# Patient Record
Sex: Male | Born: 1990 | Race: White | Hispanic: No | Marital: Single | Smoking: Former smoker
Health system: Southern US, Community
[De-identification: ages and names within clinical notes are randomized; demographics above are authoritative.]

## PROBLEM LIST (undated history)

## (undated) DIAGNOSIS — J189 Pneumonia, unspecified organism: Secondary | ICD-10-CM

## (undated) DIAGNOSIS — K219 Gastro-esophageal reflux disease without esophagitis: Secondary | ICD-10-CM

## (undated) HISTORY — PX: HERNIA REPAIR: SHX51

## (undated) HISTORY — PX: WISDOM TOOTH EXTRACTION: SHX21

---
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
## 2010-07-05 ENCOUNTER — Emergency Department (HOSPITAL_COMMUNITY)
Admission: EM | Admit: 2010-07-05 | Discharge: 2010-07-06 | Disposition: A | Discharge status: Home / Self Care | Payer: No Typology Code available for payment source | Attending: Emergency Medicine | Admitting: Emergency Medicine

## 2010-07-05 DIAGNOSIS — M79609 Pain in unspecified limb: Secondary | ICD-10-CM | POA: Insufficient documentation

## 2010-07-05 DIAGNOSIS — T148XXA Other injury of unspecified body region, initial encounter: Secondary | ICD-10-CM | POA: Insufficient documentation

## 2010-07-05 DIAGNOSIS — M542 Cervicalgia: Secondary | ICD-10-CM | POA: Insufficient documentation

## 2010-07-06 ENCOUNTER — Emergency Department (HOSPITAL_COMMUNITY): Payer: No Typology Code available for payment source

## 2010-07-07 LAB — CBC
MCHC: 34 g/dL (ref 30.0–36.0)
Platelets: 122 10*3/uL — ABNORMAL LOW (ref 150–400)
RDW: 12 % (ref 11.5–15.5)

## 2010-07-07 LAB — COMPREHENSIVE METABOLIC PANEL
ALT: 16 U/L (ref 0–53)
AST: 23 U/L (ref 0–37)
Calcium: 9.1 mg/dL (ref 8.4–10.5)
Creatinine, Ser: 1.1 mg/dL (ref 0.4–1.5)
GFR calc Af Amer: >60 mL/min (ref >60)
Sodium: 141 mEq/L (ref 135–145)
Total Protein: 6.7 g/dL (ref 6.0–8.3)

## 2010-07-07 LAB — DIFFERENTIAL
Eosinophils Absolute: 0.1 10*3/uL (ref 0.0–0.7)
Eosinophils Relative: 1 % (ref 0–5)
Lymphocytes Relative: 22 % (ref 12–46)
Lymphs Abs: 2 10*3/uL (ref 0.7–4.0)
Monocytes Relative: 9 % (ref 3–12)
Neutrophils Relative %: 68 % (ref 43–77)

## 2010-07-07 LAB — RAPID URINE DRUG SCREEN, HOSP PERFORMED
Amphetamines: NOT DETECTED
Barbiturates: NOT DETECTED
Benzodiazepines: POSITIVE — AB
Cocaine: POSITIVE — AB
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
## 2019-08-15 ENCOUNTER — Other Ambulatory Visit: Payer: Self-pay

## 2021-10-07 ENCOUNTER — Other Ambulatory Visit: Payer: Self-pay

## 2021-10-07 ENCOUNTER — Emergency Department (HOSPITAL_COMMUNITY)
Admission: EM | Admit: 2021-10-07 | Discharge: 2021-10-08 | Discharge status: Against Medical Advice | Payer: Self-pay | Attending: Emergency Medicine | Admitting: Emergency Medicine

## 2021-10-07 ENCOUNTER — Encounter (HOSPITAL_COMMUNITY): Payer: Self-pay

## 2021-10-07 DIAGNOSIS — F17210 Nicotine dependence, cigarettes, uncomplicated: Secondary | ICD-10-CM | POA: Insufficient documentation

## 2021-10-07 DIAGNOSIS — M25551 Pain in right hip: Secondary | ICD-10-CM

## 2021-10-07 DIAGNOSIS — M5441 Lumbago with sciatica, right side: Secondary | ICD-10-CM | POA: Insufficient documentation

## 2021-10-07 DIAGNOSIS — R339 Retention of urine, unspecified: Secondary | ICD-10-CM | POA: Insufficient documentation

## 2021-10-07 DIAGNOSIS — Z5321 Procedure and treatment not carried out due to patient leaving prior to being seen by health care provider: Secondary | ICD-10-CM | POA: Insufficient documentation

## 2021-10-07 MED ORDER — IBUPROFEN 400 MG PO TABS
400.0000 mg | ORAL_TABLET | Freq: Once | ORAL | Status: AC | PRN
  Administered 2021-10-07: 400 mg via ORAL
  Filled 2021-10-07: qty 1

## 2021-10-07 NOTE — ED Triage Notes (Addendum)
Pt c/o R hip pain. Pt states he has been having intermittent flare ups for the last several months and tonight is the worst. Pt states the pain radiates from his R hip down his R leg. Denies injury.

## 2021-10-07 NOTE — ED Notes (Signed)
Pt able to stand and pivot. Barely able to place weight on leg.

## 2021-10-08 ENCOUNTER — Encounter (HOSPITAL_COMMUNITY): Payer: Self-pay

## 2021-10-08 LAB — URINALYSIS, ROUTINE W REFLEX MICROSCOPIC
Bilirubin Urine: NEGATIVE
Glucose, UA: NEGATIVE mg/dL
Hgb urine dipstick: NEGATIVE
Ketones, ur: NEGATIVE mg/dL
Leukocytes,Ua: NEGATIVE
Nitrite: NEGATIVE
Protein, ur: NEGATIVE mg/dL
Specific Gravity, Urine: 1.01 (ref 1.005–1.030)
pH: 6 (ref 5.0–8.0)

## 2021-10-08 LAB — BASIC METABOLIC PANEL
Anion gap: 8 (ref 5–15)
BUN: 10 mg/dL (ref 6–20)
CO2: 26 mmol/L (ref 22–32)
Calcium: 8.8 mg/dL — ABNORMAL LOW (ref 8.9–10.3)
Chloride: 103 mmol/L (ref 98–111)
Creatinine, Ser: 0.81 mg/dL (ref 0.61–1.24)
GFR, Estimated: >60 mL/min (ref >60)
Glucose, Bld: 105 mg/dL — ABNORMAL HIGH (ref 70–99)
Potassium: 3.9 mmol/L (ref 3.5–5.1)
Sodium: 137 mmol/L (ref 135–145)

## 2021-10-08 LAB — CBC
HCT: 39.1 % (ref 39.0–52.0)
Hemoglobin: 13.2 g/dL (ref 13.0–17.0)
MCH: 31 pg (ref 26.0–34.0)
MCHC: 33.8 g/dL (ref 30.0–36.0)
MCV: 91.8 fL (ref 80.0–100.0)
Platelets: 228 10*3/uL (ref 150–400)
RBC: 4.26 MIL/uL (ref 4.22–5.81)
RDW: 11.9 % (ref 11.5–15.5)
WBC: 11.8 10*3/uL — ABNORMAL HIGH (ref 4.0–10.5)
nRBC: 0 % (ref 0.0–0.2)

## 2021-10-08 MED ORDER — KETOROLAC TROMETHAMINE 15 MG/ML IJ SOLN: 15.0000 mg | Freq: Once | INTRAMUSCULAR | Status: DC

## 2021-10-08 MED ORDER — KETOROLAC TROMETHAMINE 15 MG/ML IJ SOLN
15.0000 mg | Freq: Once | INTRAMUSCULAR | Status: DC
  Administered 2021-10-08: 15 mg via INTRAMUSCULAR
  Filled 2021-10-08: qty 1

## 2021-10-08 NOTE — ED Provider Notes (Signed)
[facility]-EMERGENCY DEPT [facility] Emergency Department Provider Note MRN:  [id number]  Arrival date & time: 10/08/21     Chief Complaint   Hip Pain (/)   History of Present Illness   Douglas Turner is a 31 y.o. year-old male with no pertinent past medical history presenting to the ED with chief complaint of hip pain.  Pain to the right lower back/buttocks with radiation down the right leg into the hip, back of the leg, lateral leg, and into the groin.  Trouble walking or moving because of the pain.  Also not urinating as frequently today.  No bowel dysfunction, no numbness or weakness to the arms or legs.  Review of Systems  A thorough review of systems was obtained and all systems are negative except as noted in the HPI and PMH.   Patient's Health History   History reviewed. No pertinent past medical history.  History reviewed. No pertinent surgical history.  No family history on file.  Social History   Socioeconomic History   Marital status: Single    Spouse name: Not on file   Number of children: Not on file   Years of education: Not on file   Highest education level: Not on file  Occupational History   Not on file  Tobacco Use   Smoking status: Every Day    Types: Cigarettes   Smokeless tobacco: Never  Vaping Use   Vaping Use: Never used  Substance and Sexual Activity   Alcohol use: Yes   Drug use: Yes    Types: Marijuana    Comment: past use of other substances   Sexual activity: Not on file  Other Topics Concern   Not on file  Social History Narrative   Not on file   Social Determinants of Health   Financial Resource Strain: Not on file  Food Insecurity: Not on file  Transportation Needs: Not on file  Physical Activity: Not on file  Stress: Not on file  Social Connections: Not on file  Intimate Partner Violence: Not on file     Physical Exam   Vitals:   10/07/21 2059  BP: (!) 133/96  Pulse: (!) 102  Resp: 20  Temp: 100.3 F (37.9 C)   SpO2: 97%    CONSTITUTIONAL: Well-appearing, NAD NEURO/PSYCH:  Alert and oriented x 3, no focal deficits EYES:  eyes equal and reactive ENT/NECK:  no LAD, no JVD CARDIO: Regular rate, well-perfused, normal S1 and S2 PULM:  CTAB no wheezing or rhonchi GI/GU:  non-distended, non-tender MSK/SPINE:  No gross deformities, no edema SKIN:  no rash, atraumatic   *Additional and/or pertinent findings included in MDM below  Diagnostic and Interventional Summary    EKG Interpretation  Date/Time:    Ventricular Rate:    PR Interval:    QRS Duration:   QT Interval:    QTC Calculation:   R Axis:     Text Interpretation:         Labs Reviewed  URINALYSIS, ROUTINE W REFLEX MICROSCOPIC  CBC  BASIC METABOLIC PANEL    MR Lumbar Spine W Wo Contrast    (Results Pending)    Medications  ketorolac (TORADOL) 15 MG/ML injection 15 mg (has no administration in time range)  ibuprofen (ADVIL) tablet 400 mg (400 mg Oral Given 10/07/21 2107)     Procedures  /  Critical Care Procedures  ED Course and Medical Decision Making  Initial Impression and Ddx Patient with mild tachycardia, oral temp 100.3, clinically with sciatica  with some red flags noted, namely the urinary retention.  Postvoid residual of 120 cc of urine.  Epidural abscess is considered, given the otherwise normal neurological exam, MRI with and without of the lumbar spine would be adequate to evaluate for this disease process.  To be transferred to [facility] by private vehicle, Dr. Madilyn Hook is the accepting EDP.  Past medical/surgical history that increases complexity of ED encounter: None  Interpretation of Diagnostics No significant blood count or electrolyte disturbance, MRI pending.  Patient Reassessment and Ultimate Disposition/Management     Awaiting transfer.  Patient management required discussion with the following services or consulting groups:  None  Complexity of Problems Addressed Acute illness or injury that poses  threat of life of bodily function  Additional Data Reviewed and Analyzed Further history obtained from: Further history from spouse/family member  Additional Factors Impacting ED Encounter Risk Consideration of hospitalization  Douglas Sow. Pilar Plate, MD [facility] Emergency Medicine [facility] [email or web address]  Final Clinical Impressions(s) / ED Diagnoses     ICD-10-CM   1. Right hip pain  M25.551     2. Acute right-sided low back pain with right-sided sciatica  M54.41     3. Urinary retention  R33.9       ED Discharge Orders     None        Discharge Instructions Discussed with and Provided to Patient:    Discharge Instructions      We are transferring you to the [facility] emergency department for MRI imaging.  You have elected to travel via private vehicle, please drive straight there.      Douglas Sous, MD 10/08/21 [phone]

## 2021-10-08 NOTE — ED Notes (Signed)
Pt arrives POV from [facility], was sent here to have MRI of lumbar spine. Pt reports pain to right groin/hip and lower back.

## 2021-10-08 NOTE — Discharge Instructions (Signed)
We are transferring you to the [facility] emergency department for MRI imaging.  You have elected to travel via private vehicle, please drive straight there.

## 2021-10-08 NOTE — ED Notes (Signed)
 left in bladder

## 2021-10-08 NOTE — ED Notes (Signed)
Pt to go pov to [facility]. Assisted into vehicle with spouse. Verbalized understanding

## 2023-08-27 NOTE — Progress Notes (Signed)
 Anesthesia Review:  PCP: Cardiologist :  PPM/ ICD: Device Orders: Rep Notified:  Chest x-ray : EKG : Echo : Stress test: Cardiac Cath :   Activity level:  Sleep Study/ CPAP : Fasting Blood Sugar :      / Checks Blood Sugar -- times a day:    Blood Thinner/ Instructions /Last Dose: ASA / Instructions/ Last Dose :

## 2023-08-27 NOTE — Progress Notes (Signed)
 Surgery orders requested via Epic inbox.

## 2023-08-28 ENCOUNTER — Ambulatory Visit: Payer: Self-pay | Admitting: Emergency Medicine

## 2023-08-28 DIAGNOSIS — G8929 Other chronic pain: Secondary | ICD-10-CM

## 2023-08-28 NOTE — H&P (Signed)
 TOTAL HIP ADMISSION H&P  Patient is admitted for left total hip arthroplasty.  Subjective:  Chief Complaint: left hip pain  HPI: Douglas Turner, 33 y.o. male, has a history of pain and functional disability in the left hip(s) due to avascular necrosis and patient has failed non-surgical conservative treatments for greater than 12 weeks to include NSAID's and/or analgesics, corticosteriod injections, supervised PT with diminished ADL's post treatment, and activity modification.  Onset of symptoms was gradual starting 1 years ago with gradually worsening course since that time.The patient noted no past surgery on the left hip(s).  Patient currently rates pain in the left hip at 10 out of 10 with activity. Patient has night pain, worsening of pain with activity and weight bearing, pain that interfers with activities of daily living, and pain with passive range of motion. Patient has evidence of avascular necrosis by imaging studies. This condition presents safety issues increasing the risk of falls.  There is no current active infection.  There are no active problems to display for this patient.  No past medical history on file.  No past surgical history on file.  Current Outpatient Medications  Medication Sig Dispense Refill Last Dose/Taking   Buprenorphine HCl-Naloxone HCl 8-2 MG FILM Place 0.25 Film under the tongue daily.      ibuprofen  (ADVIL ) 200 MG tablet Take 200 mg by mouth every 6 (six) hours as needed for moderate pain (pain score 4-6).      loratadine (CLARITIN) 10 MG tablet Take 10 mg by mouth daily.      meloxicam (MOBIC) 15 MG tablet Take 15 mg by mouth daily.      omeprazole (PRILOSEC) 40 MG capsule Take 40 mg by mouth daily.      No current facility-administered medications for this visit.   Allergies  Allergen Reactions   Iodine Shortness Of Breath, Swelling and Rash   Shellfish Allergy Anaphylaxis    Social History   Tobacco Use   Smoking status: Every Day     Types: Cigarettes   Smokeless tobacco: Never  Substance Use Topics   Alcohol use: Yes    No family history on file.   Review of Systems  Musculoskeletal:  Positive for arthralgias.  All other systems reviewed and are negative.   Objective:  Physical Exam Constitutional:      General: He is not in acute distress.    Appearance: Normal appearance. He is normal weight.  HENT:     Head: Normocephalic and atraumatic.  Eyes:     Extraocular Movements: Extraocular movements intact.     Conjunctiva/sclera: Conjunctivae normal.     Pupils: Pupils are equal, round, and reactive to light.  Cardiovascular:     Rate and Rhythm: Normal rate and regular rhythm.     Pulses: Normal pulses.     Heart sounds: Normal heart sounds.  Pulmonary:     Effort: Pulmonary effort is normal. No respiratory distress.     Breath sounds: Normal breath sounds.  Abdominal:     General: Bowel sounds are normal. There is no distension.     Palpations: Abdomen is soft.     Tenderness: There is no abdominal tenderness.  Musculoskeletal:        General: Tenderness present.     Cervical back: Normal range of motion and neck supple.     Comments: TTP over groin, lateral aspect, greater trochanter.  No IT band tenderness.  No significant swelling.  No overlying lesions of area of chief complaint.  Decreased strength and ROM due to elicited pain.  Dorsiflexion and plantarflexion intact.  BLE appear grossly neurovascularly intact.  Gait mildly antalgic.   Lymphadenopathy:     Cervical: No cervical adenopathy.  Skin:    General: Skin is warm and dry.     Capillary Refill: Capillary refill takes less than 2 seconds.     Findings: No erythema or rash.  Neurological:     General: No focal deficit present.     Mental Status: He is alert and oriented to person, place, and time.  Psychiatric:        Mood and Affect: Mood normal.        Behavior: Behavior normal.     Vital signs in last 24  hours: @VSRANGES @  Labs:   Estimated body mass index is 27.44 kg/m as calculated from the following:   Height as of 10/07/21: 5\' 6"  (1.676 m).   Weight as of 10/07/21: 77.1 kg.   Imaging Review Plain radiographs demonstrate avascular necrosis with femoral head collapse of the left hip(s). The bone quality appears to be fair for age and reported activity level.      Assessment/Plan:  Avascular necrosis, left hip(s)  The patient history, physical examination, clinical judgement of the provider and imaging studies are consistent with avascular necrosis of the left hip(s) and total hip arthroplasty is deemed medically necessary. The treatment options including medical management, injection therapy, arthroscopy and arthroplasty were discussed at length. The risks and benefits of total hip arthroplasty were presented and reviewed. The risks due to aseptic loosening, infection, stiffness, dislocation/subluxation,  thromboembolic complications and other imponderables were discussed.  The patient acknowledged the explanation, agreed to proceed with the plan and consent was signed. Patient is being admitted for inpatient treatment for surgery, pain control, PT, OT, prophylactic antibiotics, VTE prophylaxis, progressive ambulation and ADL's and discharge planning.The patient is planning to be discharged home with outpatient PT.    Patient's anticipated LOS is less than 2 midnights, meeting these requirements: - Younger than 68 - Lives within 1 hour of care - Has a competent adult at home to recover with post-op recover - NO history of  - Chronic pain requiring opiods  - Diabetes  - Coronary Artery Disease  - Heart failure  - Heart attack  - Stroke  - DVT/VTE  - Cardiac arrhythmia  - Respiratory Failure/COPD  - Renal failure  - Anemia  - Advanced Liver disease

## 2023-08-28 NOTE — H&P (View-Only) (Signed)
 TOTAL HIP ADMISSION H&P  Patient is admitted for left total hip arthroplasty.  Subjective:  Chief Complaint: left hip pain  HPI: Douglas Turner, 33 y.o. male, has a history of pain and functional disability in the left hip(s) due to avascular necrosis and patient has failed non-surgical conservative treatments for greater than 12 weeks to include NSAID's and/or analgesics, corticosteriod injections, supervised PT with diminished ADL's post treatment, and activity modification.  Onset of symptoms was gradual starting 1 years ago with gradually worsening course since that time.The patient noted no past surgery on the left hip(s).  Patient currently rates pain in the left hip at 10 out of 10 with activity. Patient has night pain, worsening of pain with activity and weight bearing, pain that interfers with activities of daily living, and pain with passive range of motion. Patient has evidence of avascular necrosis by imaging studies. This condition presents safety issues increasing the risk of falls.  There is no current active infection.  There are no active problems to display for this patient.  No past medical history on file.  No past surgical history on file.  Current Outpatient Medications  Medication Sig Dispense Refill Last Dose/Taking   Buprenorphine HCl-Naloxone HCl 8-2 MG FILM Place 0.25 Film under the tongue daily.      ibuprofen  (ADVIL ) 200 MG tablet Take 200 mg by mouth every 6 (six) hours as needed for moderate pain (pain score 4-6).      loratadine (CLARITIN) 10 MG tablet Take 10 mg by mouth daily.      meloxicam (MOBIC) 15 MG tablet Take 15 mg by mouth daily.      omeprazole (PRILOSEC) 40 MG capsule Take 40 mg by mouth daily.      No current facility-administered medications for this visit.   Allergies  Allergen Reactions   Iodine Shortness Of Breath, Swelling and Rash   Shellfish Allergy Anaphylaxis    Social History   Tobacco Use   Smoking status: Every Day     Types: Cigarettes   Smokeless tobacco: Never  Substance Use Topics   Alcohol use: Yes    No family history on file.   Review of Systems  Musculoskeletal:  Positive for arthralgias.  All other systems reviewed and are negative.   Objective:  Physical Exam Constitutional:      General: He is not in acute distress.    Appearance: Normal appearance. He is normal weight.  HENT:     Head: Normocephalic and atraumatic.  Eyes:     Extraocular Movements: Extraocular movements intact.     Conjunctiva/sclera: Conjunctivae normal.     Pupils: Pupils are equal, round, and reactive to light.  Cardiovascular:     Rate and Rhythm: Normal rate and regular rhythm.     Pulses: Normal pulses.     Heart sounds: Normal heart sounds.  Pulmonary:     Effort: Pulmonary effort is normal. No respiratory distress.     Breath sounds: Normal breath sounds.  Abdominal:     General: Bowel sounds are normal. There is no distension.     Palpations: Abdomen is soft.     Tenderness: There is no abdominal tenderness.  Musculoskeletal:        General: Tenderness present.     Cervical back: Normal range of motion and neck supple.     Comments: TTP over groin, lateral aspect, greater trochanter.  No IT band tenderness.  No significant swelling.  No overlying lesions of area of chief complaint.  Decreased strength and ROM due to elicited pain.  Dorsiflexion and plantarflexion intact.  BLE appear grossly neurovascularly intact.  Gait mildly antalgic.   Lymphadenopathy:     Cervical: No cervical adenopathy.  Skin:    General: Skin is warm and dry.     Capillary Refill: Capillary refill takes less than 2 seconds.     Findings: No erythema or rash.  Neurological:     General: No focal deficit present.     Mental Status: He is alert and oriented to person, place, and time.  Psychiatric:        Mood and Affect: Mood normal.        Behavior: Behavior normal.     Vital signs in last 24  hours: @VSRANGES @  Labs:   Estimated body mass index is 27.44 kg/m as calculated from the following:   Height as of 10/07/21: 5\' 6"  (1.676 m).   Weight as of 10/07/21: 77.1 kg.   Imaging Review Plain radiographs demonstrate avascular necrosis with femoral head collapse of the left hip(s). The bone quality appears to be fair for age and reported activity level.      Assessment/Plan:  Avascular necrosis, left hip(s)  The patient history, physical examination, clinical judgement of the provider and imaging studies are consistent with avascular necrosis of the left hip(s) and total hip arthroplasty is deemed medically necessary. The treatment options including medical management, injection therapy, arthroscopy and arthroplasty were discussed at length. The risks and benefits of total hip arthroplasty were presented and reviewed. The risks due to aseptic loosening, infection, stiffness, dislocation/subluxation,  thromboembolic complications and other imponderables were discussed.  The patient acknowledged the explanation, agreed to proceed with the plan and consent was signed. Patient is being admitted for inpatient treatment for surgery, pain control, PT, OT, prophylactic antibiotics, VTE prophylaxis, progressive ambulation and ADL's and discharge planning.The patient is planning to be discharged home with outpatient PT.    Patient's anticipated LOS is less than 2 midnights, meeting these requirements: - Younger than 68 - Lives within 1 hour of care - Has a competent adult at home to recover with post-op recover - NO history of  - Chronic pain requiring opiods  - Diabetes  - Coronary Artery Disease  - Heart failure  - Heart attack  - Stroke  - DVT/VTE  - Cardiac arrhythmia  - Respiratory Failure/COPD  - Renal failure  - Anemia  - Advanced Liver disease

## 2023-08-30 NOTE — Patient Instructions (Signed)
 SURGICAL WAITING ROOM VISITATION  Patients having surgery or a procedure may have no more than 2 support people in the waiting area - these visitors may rotate.    Children under the age of 49 must have an adult with them who is not the patient.   Visitors with respiratory illnesses are discouraged from visiting and should remain at home.  If the patient needs to stay at the hospital during part of their recovery, the visitor guidelines for inpatient rooms apply. Pre-op nurse will coordinate an appropriate time for 1 support person to accompany patient in pre-op.  This support person may not rotate.    Please refer to the [facility] website for the visitor guidelines for Inpatients (after your surgery is over and you are in a regular room).       Your procedure is scheduled on:  09/15/23    Report to [facility] Main Entrance    Report to admitting at  0515 AM   Call this number if you have problems the morning of surgery [phone]   Do not eat food :After Midnight.   After Midnight you may have the following liquids until __ 0415____ AM  DAY OF SURGERY  Water Non-Citrus Juices (without pulp, NO RED-Apple, White grape, White cranberry) Black Coffee (NO MILK/CREAM OR CREAMERS, sugar ok)  Clear Tea (NO MILK/CREAM OR CREAMERS, sugar ok) regular and decaf                             Plain Jell-O (NO RED)                                           Fruit ices (not with fruit pulp, NO RED)                                     Popsicles (NO RED)                                                               Sports drinks like Gatorade (NO RED)                   The day of surgery:  Drink ONE (1) Pre-Surgery Clear Ensure or G2 at  0415AM  ( have completed by )  the morning of surgery. Drink in one sitting. Do not sip.  This drink was given to you during your hospital  pre-op appointment visit. Nothing else to drink after completing the  Pre-Surgery Clear Ensure or G2.           If you have questions, please contact your surgeon's office.   FOLLOW BOWEL PREP AND ANY ADDITIONAL PRE OP INSTRUCTIONS YOU RECEIVED FROM YOUR SURGEON'S OFFICE!!!     Oral Hygiene is also important to reduce your risk of infection.                                    Remember - BRUSH YOUR TEETH THE  MORNING OF SURGERY WITH YOUR REGULAR TOOTHPASTE  DENTURES WILL BE REMOVED PRIOR TO SURGERY PLEASE DO NOT APPLY "Poly grip" OR ADHESIVES!!!   Do NOT smoke after Midnight   Stop all vitamins and herbal supplements 7 days before surgery.   Take these medicines the morning of surgery with A SIP OF WATER:  claritin, omeprazole   DO NOT TAKE ANY ORAL DIABETIC MEDICATIONS DAY OF YOUR SURGERY  Bring CPAP mask and tubing day of surgery.                              You may not have any metal on your body including hair pins, jewelry, and body piercing             Do not wear make-up, lotions, powders, perfumes/cologne, or deodorant  Do not wear nail polish including gel and S&S, artificial/acrylic nails, or any other type of covering on natural nails including finger and toenails. If you have artificial nails, gel coating, etc. that needs to be removed by a nail salon please have this removed prior to surgery or surgery may need to be canceled/ delayed if the surgeon/ anesthesia feels like they are unable to be safely monitored.   Do not shave  48 hours prior to surgery.               Men may shave face and neck.   Do not bring valuables to the hospital. [facility] IS NOT             RESPONSIBLE   FOR VALUABLES.   Contacts, glasses, dentures or bridgework may not be worn into surgery.   Bring small overnight bag day of surgery.   DO NOT BRING YOUR HOME MEDICATIONS TO THE HOSPITAL. PHARMACY WILL DISPENSE MEDICATIONS LISTED ON YOUR MEDICATION LIST TO YOU DURING YOUR ADMISSION IN THE HOSPITAL!    Patients discharged on the day of surgery will not be allowed to drive home.  Someone NEEDS  to stay with you for the first 24 hours after anesthesia.   Special Instructions: Bring a copy of your healthcare power of attorney and living will documents the day of surgery if you haven't scanned them before.              Please read over the following fact sheets you were given: IF YOU HAVE QUESTIONS ABOUT YOUR PRE-OP INSTRUCTIONS PLEASE CALL [phone]   If you received a COVID test during your pre-op visit  it is requested that you wear a mask when out in public, stay away from anyone that may not be feeling well and notify your surgeon if you develop symptoms. If you test positive for Covid or have been in contact with anyone that has tested positive in the last 10 days please notify you surgeon.      Pre-operative 5 CHG Bath Instructions   You can play a key role in reducing the risk of infection after surgery. Your skin needs to be as free of germs as possible. You can reduce the number of germs on your skin by washing with CHG (chlorhexidine gluconate) soap before surgery. CHG is an antiseptic soap that kills germs and continues to kill germs even after washing.   DO NOT use if you have an allergy to chlorhexidine/CHG or antibacterial soaps. If your skin becomes reddened or irritated, stop using the CHG and notify one of our RNs at [phone].   Please shower  with the CHG soap starting 4 days before surgery using the following schedule:     Please keep in mind the following:  DO NOT shave, including legs and underarms, starting the day of your first shower.   You may shave your face at any point before/day of surgery.  Place clean sheets on your bed the day you start using CHG soap. Use a clean washcloth (not used since being washed) for each shower. DO NOT sleep with pets once you start using the CHG.   CHG Shower Instructions:  If you choose to wash your hair and private area, wash first with your normal shampoo/soap.  After you use shampoo/soap, rinse your hair and  body thoroughly to remove shampoo/soap residue.  Turn the water OFF and apply about 3 tablespoons (45 ml) of CHG soap to a CLEAN washcloth.  Apply CHG soap ONLY FROM YOUR NECK DOWN TO YOUR TOES (washing for 3-5 minutes)  DO NOT use CHG soap on face, private areas, open wounds, or sores.  Pay special attention to the area where your surgery is being performed.  If you are having back surgery, having someone wash your back for you may be helpful. Wait 2 minutes after CHG soap is applied, then you may rinse off the CHG soap.  Pat dry with a clean towel  Put on clean clothes/pajamas   If you choose to wear lotion, please use ONLY the CHG-compatible lotions on the back of this paper.     Additional instructions for the day of surgery: DO NOT APPLY any lotions, deodorants, cologne, or perfumes.   Put on clean/comfortable clothes.  Brush your teeth.  Ask your nurse before applying any prescription medications to the skin.      CHG Compatible Lotions   Aveeno Moisturizing lotion  Cetaphil Moisturizing Cream  Cetaphil Moisturizing Lotion  Clairol Herbal Essence Moisturizing Lotion, Dry Skin  Clairol Herbal Essence Moisturizing Lotion, Extra Dry Skin  Clairol Herbal Essence Moisturizing Lotion, Normal Skin  Curel Age Defying Therapeutic Moisturizing Lotion with Alpha Hydroxy  Curel Extreme Care Body Lotion  Curel Soothing Hands Moisturizing Hand Lotion  Curel Therapeutic Moisturizing Cream, Fragrance-Free  Curel Therapeutic Moisturizing Lotion, Fragrance-Free  Curel Therapeutic Moisturizing Lotion, Original Formula  Eucerin Daily Replenishing Lotion  Eucerin Dry Skin Therapy Plus Alpha Hydroxy Crme  Eucerin Dry Skin Therapy Plus Alpha Hydroxy Lotion  Eucerin Original Crme  Eucerin Original Lotion  Eucerin Plus Crme Eucerin Plus Lotion  Eucerin TriLipid Replenishing Lotion  Keri Anti-Bacterial Hand Lotion  Keri Deep Conditioning Original Lotion Dry Skin Formula Softly Scented   Keri Deep Conditioning Original Lotion, Fragrance Free Sensitive Skin Formula  Keri Lotion Fast Absorbing Fragrance Free Sensitive Skin Formula  Keri Lotion Fast Absorbing Softly Scented Dry Skin Formula  Keri Original Lotion  Keri Skin Renewal Lotion Keri Silky Smooth Lotion  Keri Silky Smooth Sensitive Skin Lotion  Nivea Body Creamy Conditioning Oil  Nivea Body Extra Enriched Teacher, adult education Moisturizing Lotion Nivea Crme  Nivea Skin Firming Lotion  NutraDerm 30 Skin Lotion  NutraDerm Skin Lotion  NutraDerm Therapeutic Skin Cream  NutraDerm Therapeutic Skin Lotion  ProShield Protective Hand Cream  Provon moisturizing lotion

## 2023-09-02 ENCOUNTER — Encounter (HOSPITAL_COMMUNITY)
Admission: RE | Admit: 2023-09-02 | Discharge: 2023-09-02 | Disposition: A | Discharge status: Home / Self Care | Payer: Self-pay | Source: Ambulatory Visit | Attending: Orthopedic Surgery | Admitting: Orthopedic Surgery

## 2023-09-02 ENCOUNTER — Ambulatory Visit: Payer: Self-pay | Admitting: Emergency Medicine

## 2023-09-02 ENCOUNTER — Encounter (HOSPITAL_COMMUNITY): Payer: Self-pay

## 2023-09-02 ENCOUNTER — Other Ambulatory Visit: Payer: Self-pay

## 2023-09-02 VITALS — BP 121/96 | HR 77 | Temp 98.5°F | Resp 16 | Ht 66.0 in | Wt 171.0 lb

## 2023-09-02 DIAGNOSIS — G8929 Other chronic pain: Secondary | ICD-10-CM | POA: Diagnosis not present

## 2023-09-02 DIAGNOSIS — M25552 Pain in left hip: Secondary | ICD-10-CM | POA: Insufficient documentation

## 2023-09-02 DIAGNOSIS — Z01818 Encounter for other preprocedural examination: Secondary | ICD-10-CM | POA: Diagnosis present

## 2023-09-02 DIAGNOSIS — Z01812 Encounter for preprocedural laboratory examination: Secondary | ICD-10-CM | POA: Diagnosis not present

## 2023-09-02 HISTORY — DX: Pneumonia, unspecified organism: J18.9

## 2023-09-02 HISTORY — DX: Gastro-esophageal reflux disease without esophagitis: K21.9

## 2023-09-02 LAB — CBC WITH DIFFERENTIAL/PLATELET
Abs Immature Granulocytes: 0.02 10*3/uL (ref 0.00–0.07)
Basophils Absolute: 0 10*3/uL (ref 0.0–0.1)
Basophils Relative: 1 %
Eosinophils Absolute: 0.1 10*3/uL (ref 0.0–0.5)
Eosinophils Relative: 1 %
HCT: 39 % (ref 39.0–52.0)
Hemoglobin: 13.6 g/dL (ref 13.0–17.0)
Immature Granulocytes: 0 %
Lymphocytes Relative: 23 %
Lymphs Abs: 1.4 10*3/uL (ref 0.7–4.0)
MCH: 30.6 pg (ref 26.0–34.0)
MCHC: 34.9 g/dL (ref 30.0–36.0)
MCV: 87.6 fL (ref 80.0–100.0)
Monocytes Absolute: 0.7 10*3/uL (ref 0.1–1.0)
Monocytes Relative: 11 %
Neutro Abs: 4.1 10*3/uL (ref 1.7–7.7)
Neutrophils Relative %: 64 %
Platelets: 179 10*3/uL (ref 150–400)
RBC: 4.45 MIL/uL (ref 4.22–5.81)
RDW: 11.7 % (ref 11.5–15.5)
WBC: 6.3 10*3/uL (ref 4.0–10.5)
nRBC: 0 % (ref 0.0–0.2)

## 2023-09-02 LAB — COMPREHENSIVE METABOLIC PANEL WITH GFR
ALT: 25 U/L (ref 0–44)
AST: 21 U/L (ref 15–41)
Albumin: 4.1 g/dL (ref 3.5–5.0)
Alkaline Phosphatase: 58 U/L (ref 38–126)
Anion gap: 8 (ref 5–15)
BUN: 16 mg/dL (ref 6–20)
CO2: 25 mmol/L (ref 22–32)
Calcium: 8.8 mg/dL — ABNORMAL LOW (ref 8.9–10.3)
Chloride: 106 mmol/L (ref 98–111)
Creatinine, Ser: 0.81 mg/dL (ref 0.61–1.24)
GFR, Estimated: >60 mL/min (ref >60)
Glucose, Bld: 98 mg/dL (ref 70–99)
Potassium: 4.2 mmol/L (ref 3.5–5.1)
Sodium: 139 mmol/L (ref 135–145)
Total Bilirubin: 1 mg/dL (ref 0.0–1.2)
Total Protein: 7.5 g/dL (ref 6.5–8.1)

## 2023-09-02 LAB — TYPE AND SCREEN
ABO/RH(D): A NEG
Antibody Screen: NEGATIVE

## 2023-09-02 LAB — SURGICAL PCR SCREEN
MRSA, PCR: NEGATIVE
Staphylococcus aureus: NEGATIVE

## 2023-09-02 NOTE — Progress Notes (Addendum)
 Anesthesia Review: SUBOXONE will not take DOS per pt.   JXB:JYNWG Sharion Davidson, MD Cardiologist :no  PPM/ ICD: Device Orders: Rep Notified:  Chest x-ray : EKG : Echo : Stress test: Cardiac Cath :   Activity level: Able to climb a flight of stairs and ladders with his work with no Cp or SOB  Sleep Study/ CPAP :N/A Fasting Blood Sugar : N/A     / Checks Blood Sugar -- times a day:    Blood Thinner/ Instructions /Last Dose:n/a ASA / Instructions/ Last Dose : n/a

## 2023-09-15 ENCOUNTER — Ambulatory Visit (HOSPITAL_COMMUNITY): Admitting: Certified Registered Nurse Anesthetist

## 2023-09-15 ENCOUNTER — Encounter (HOSPITAL_COMMUNITY): Payer: Self-pay | Admitting: Orthopedic Surgery

## 2023-09-15 ENCOUNTER — Ambulatory Visit (HOSPITAL_COMMUNITY)

## 2023-09-15 ENCOUNTER — Encounter (HOSPITAL_COMMUNITY)
Admission: RE | Disposition: A | Discharge status: Home / Self Care | Payer: Self-pay | Source: Ambulatory Visit | Attending: Orthopedic Surgery

## 2023-09-15 ENCOUNTER — Other Ambulatory Visit: Payer: Self-pay

## 2023-09-15 ENCOUNTER — Ambulatory Visit (HOSPITAL_COMMUNITY): Payer: Self-pay | Admitting: Physician Assistant

## 2023-09-15 ENCOUNTER — Ambulatory Visit (HOSPITAL_COMMUNITY)
Admission: RE | Admit: 2023-09-15 | Discharge: 2023-09-15 | Disposition: A | Discharge status: Home / Self Care | Payer: Self-pay | Source: Ambulatory Visit | Attending: Orthopedic Surgery | Admitting: Orthopedic Surgery

## 2023-09-15 DIAGNOSIS — F17210 Nicotine dependence, cigarettes, uncomplicated: Secondary | ICD-10-CM | POA: Insufficient documentation

## 2023-09-15 DIAGNOSIS — Z791 Long term (current) use of non-steroidal anti-inflammatories (NSAID): Secondary | ICD-10-CM | POA: Diagnosis not present

## 2023-09-15 DIAGNOSIS — K219 Gastro-esophageal reflux disease without esophagitis: Secondary | ICD-10-CM | POA: Diagnosis not present

## 2023-09-15 DIAGNOSIS — M1612 Unilateral primary osteoarthritis, left hip: Secondary | ICD-10-CM

## 2023-09-15 DIAGNOSIS — M879 Osteonecrosis, unspecified: Secondary | ICD-10-CM | POA: Diagnosis not present

## 2023-09-15 DIAGNOSIS — Z79899 Other long term (current) drug therapy: Secondary | ICD-10-CM | POA: Insufficient documentation

## 2023-09-15 HISTORY — PX: TOTAL HIP ARTHROPLASTY: SHX124

## 2023-09-15 LAB — ABO/RH: ABO/RH(D): A NEG

## 2023-09-15 SURGERY — ARTHROPLASTY, HIP, TOTAL,POSTERIOR APPROACH
Anesthesia: Spinal | Site: Hip | Laterality: Left

## 2023-09-15 MED ORDER — OXYCODONE HCL 5 MG PO TABS
5.0000 mg | ORAL_TABLET | Freq: Once | ORAL | Status: AC | PRN
  Administered 2023-09-15: 5 mg via ORAL

## 2023-09-15 MED ORDER — SODIUM CHLORIDE 0.9 % IV SOLN: INTRAVENOUS | Status: DC

## 2023-09-15 MED ORDER — ACETAMINOPHEN 500 MG PO TABS
ORAL_TABLET | ORAL | Status: AC
  Filled 2023-09-15: qty 2

## 2023-09-15 MED ORDER — CEFAZOLIN SODIUM-DEXTROSE 2-4 GM/100ML-% IV SOLN
INTRAVENOUS | Status: AC
  Filled 2023-09-15: qty 100

## 2023-09-15 MED ORDER — DEXAMETHASONE SODIUM PHOSPHATE 10 MG/ML IJ SOLN
8.0000 mg | Freq: Once | INTRAMUSCULAR | Status: AC
Start: 2023-09-15 — End: 2023-09-15
  Administered 2023-09-15: 10 mg via INTRAVENOUS

## 2023-09-15 MED ORDER — MIDAZOLAM HCL 2 MG/2ML IJ SOLN
INTRAMUSCULAR | Status: AC
  Filled 2023-09-15: qty 2

## 2023-09-15 MED ORDER — BUPIVACAINE-EPINEPHRINE (PF) 0.25% -1:200000 IJ SOLN
INTRAMUSCULAR | Status: AC
  Filled 2023-09-15: qty 30

## 2023-09-15 MED ORDER — FENTANYL CITRATE (PF) 100 MCG/2ML IJ SOLN
INTRAMUSCULAR | Status: AC
  Filled 2023-09-15: qty 2

## 2023-09-15 MED ORDER — LACTATED RINGERS IV BOLUS
500.0000 mL | Freq: Once | INTRAVENOUS | Status: AC
  Administered 2023-09-15: 500 mL via INTRAVENOUS

## 2023-09-15 MED ORDER — CEFAZOLIN SODIUM-DEXTROSE 2-4 GM/100ML-% IV SOLN
2.0000 g | INTRAVENOUS | Status: AC
  Administered 2023-09-15: 2 g via INTRAVENOUS
  Filled 2023-09-15: qty 100

## 2023-09-15 MED ORDER — BUPIVACAINE IN DEXTROSE 0.75-8.25 % IT SOLN
INTRATHECAL | Status: DC | PRN
Start: 2023-09-15 — End: 2023-09-15
  Administered 2023-09-15: 2 mL via INTRATHECAL

## 2023-09-15 MED ORDER — OXYCODONE HCL 5 MG/5ML PO SOLN: 5.0000 mg | Freq: Once | ORAL | Status: AC | PRN

## 2023-09-15 MED ORDER — LACTATED RINGERS IV SOLN: INTRAVENOUS | Status: DC

## 2023-09-15 MED ORDER — PROPOFOL 10 MG/ML IV BOLUS
INTRAVENOUS | Status: DC | PRN
  Administered 2023-09-15: 40 mg via INTRAVENOUS
  Administered 2023-09-15: 50 ug/kg/min via INTRAVENOUS
  Administered 2023-09-15: 40 mg via INTRAVENOUS

## 2023-09-15 MED ORDER — SODIUM CHLORIDE (PF) 0.9 % IJ SOLN
INTRAMUSCULAR | Status: DC | PRN
  Administered 2023-09-15: 30 mL

## 2023-09-15 MED ORDER — ONDANSETRON HCL 4 MG/2ML IJ SOLN
INTRAMUSCULAR | Status: AC
  Filled 2023-09-15: qty 2

## 2023-09-15 MED ORDER — MIDAZOLAM HCL 5 MG/5ML IJ SOLN
INTRAMUSCULAR | Status: DC | PRN
  Administered 2023-09-15 (×2): 1 mg via INTRAVENOUS

## 2023-09-15 MED ORDER — CHLORHEXIDINE GLUCONATE 0.12 % MT SOLN: 15.0000 mL | Freq: Once | OROMUCOSAL | Status: DC

## 2023-09-15 MED ORDER — BUPIVACAINE LIPOSOME 1.3 % IJ SUSP: 10.0000 mL | Freq: Once | INTRAMUSCULAR | Status: AC

## 2023-09-15 MED ORDER — DEXAMETHASONE SODIUM PHOSPHATE 10 MG/ML IJ SOLN
INTRAMUSCULAR | Status: AC
  Filled 2023-09-15: qty 1

## 2023-09-15 MED ORDER — ONDANSETRON HCL 4 MG/2ML IJ SOLN
INTRAMUSCULAR | Status: DC | PRN
  Administered 2023-09-15: 4 mg via INTRAVENOUS

## 2023-09-15 MED ORDER — ACETAMINOPHEN 325 MG PO TABS: 325.0000 mg | ORAL_TABLET | Freq: Four times a day (QID) | ORAL | Status: DC | PRN

## 2023-09-15 MED ORDER — ACETAMINOPHEN 500 MG PO TABS: 1000.0000 mg | ORAL_TABLET | Freq: Three times a day (TID) | ORAL | Status: AC | PRN

## 2023-09-15 MED ORDER — OXYCODONE HCL 5 MG PO TABS
ORAL_TABLET | ORAL | Status: AC
  Filled 2023-09-15: qty 1

## 2023-09-15 MED ORDER — OXYCODONE HCL 5 MG PO TABS
ORAL_TABLET | ORAL | Status: AC
Start: 2023-09-15 — End: ?
  Filled 2023-09-15: qty 1

## 2023-09-15 MED ORDER — POLYETHYLENE GLYCOL 3350 17 G PO PACK: 17.0000 g | PACK | Freq: Every day | ORAL | 0 refills | Status: AC

## 2023-09-15 MED ORDER — BUPIVACAINE LIPOSOME 1.3 % IJ SUSP
INTRAMUSCULAR | Status: DC | PRN
  Administered 2023-09-15: 20 mL

## 2023-09-15 MED ORDER — CEFAZOLIN SODIUM-DEXTROSE 2-4 GM/100ML-% IV SOLN
2.0000 g | Freq: Four times a day (QID) | INTRAVENOUS | Status: DC
  Administered 2023-09-15: 2 g via INTRAVENOUS

## 2023-09-15 MED ORDER — ACETAMINOPHEN 500 MG PO TABS
1000.0000 mg | ORAL_TABLET | Freq: Once | ORAL | Status: AC
  Administered 2023-09-15: 1000 mg via ORAL
  Filled 2023-09-15: qty 2

## 2023-09-15 MED ORDER — METHOCARBAMOL 1000 MG/10ML IJ SOLN: 500.0000 mg | Freq: Four times a day (QID) | INTRAMUSCULAR | Status: DC | PRN

## 2023-09-15 MED ORDER — OXYCODONE HCL 5 MG PO TABS
5.0000 mg | ORAL_TABLET | ORAL | Status: DC | PRN
  Administered 2023-09-15: 5 mg via ORAL

## 2023-09-15 MED ORDER — MELOXICAM 15 MG PO TABS
15.0000 mg | ORAL_TABLET | Freq: Every day | ORAL | 0 refills | Status: DC
Start: 2023-09-15 — End: 2023-10-27

## 2023-09-15 MED ORDER — BUPIVACAINE-EPINEPHRINE 0.25% -1:200000 IJ SOLN
INTRAMUSCULAR | Status: DC | PRN
  Administered 2023-09-15: 30 mL

## 2023-09-15 MED ORDER — ISOPROPYL ALCOHOL 70 % SOLN
Status: DC | PRN
  Administered 2023-09-15: 1 via TOPICAL

## 2023-09-15 MED ORDER — SODIUM CHLORIDE (PF) 0.9 % IJ SOLN
INTRAMUSCULAR | Status: AC
  Filled 2023-09-15: qty 50

## 2023-09-15 MED ORDER — HYDROMORPHONE HCL 1 MG/ML IJ SOLN
0.2500 mg | INTRAMUSCULAR | Status: DC | PRN
  Administered 2023-09-15 (×3): 0.5 mg via INTRAVENOUS

## 2023-09-15 MED ORDER — PHENYLEPHRINE HCL (PRESSORS) 10 MG/ML IV SOLN
INTRAVENOUS | Status: DC | PRN
Start: 2023-09-15 — End: 2023-09-15
  Administered 2023-09-15: 160 ug via INTRAVENOUS
  Administered 2023-09-15: 80 ug via INTRAVENOUS

## 2023-09-15 MED ORDER — ISOPROPYL ALCOHOL 70 % SOLN
Status: AC
Start: 2023-09-15 — End: ?
  Filled 2023-09-15: qty 480

## 2023-09-15 MED ORDER — SODIUM CHLORIDE 0.9 % IR SOLN
Status: DC | PRN
  Administered 2023-09-15: 1000 mL

## 2023-09-15 MED ORDER — LACTATED RINGERS IV BOLUS
250.0000 mL | Freq: Once | INTRAVENOUS | Status: AC
  Administered 2023-09-15: 250 mL via INTRAVENOUS

## 2023-09-15 MED ORDER — TRANEXAMIC ACID-NACL 1000-0.7 MG/100ML-% IV SOLN
1000.0000 mg | INTRAVENOUS | Status: AC
  Administered 2023-09-15: 1000 mg via INTRAVENOUS
  Filled 2023-09-15: qty 100

## 2023-09-15 MED ORDER — METHOCARBAMOL 500 MG PO TABS: 500.0000 mg | ORAL_TABLET | Freq: Three times a day (TID) | ORAL | 0 refills | Status: AC | PRN

## 2023-09-15 MED ORDER — ONDANSETRON HCL 4 MG PO TABS: 4.0000 mg | ORAL_TABLET | Freq: Three times a day (TID) | ORAL | 0 refills | Status: AC | PRN

## 2023-09-15 MED ORDER — METHOCARBAMOL 500 MG PO TABS
ORAL_TABLET | ORAL | Status: AC
  Filled 2023-09-15: qty 1

## 2023-09-15 MED ORDER — SODIUM CHLORIDE 0.9 % IV SOLN: 12.5000 mg | INTRAVENOUS | Status: DC | PRN

## 2023-09-15 MED ORDER — HYDROMORPHONE HCL 1 MG/ML IJ SOLN
INTRAMUSCULAR | Status: AC
  Filled 2023-09-15: qty 1

## 2023-09-15 MED ORDER — METHOCARBAMOL 500 MG PO TABS
500.0000 mg | ORAL_TABLET | Freq: Four times a day (QID) | ORAL | Status: DC | PRN
  Administered 2023-09-15: 500 mg via ORAL

## 2023-09-15 MED ORDER — GLYCOPYRROLATE 0.2 MG/ML IJ SOLN
INTRAMUSCULAR | Status: DC | PRN
Start: 2023-09-15 — End: 2023-09-15
  Administered 2023-09-15 (×2): .1 mg via INTRAVENOUS

## 2023-09-15 MED ORDER — ASPIRIN 81 MG PO TBEC: 81.0000 mg | DELAYED_RELEASE_TABLET | Freq: Two times a day (BID) | ORAL | Status: AC

## 2023-09-15 MED ORDER — POVIDONE-IODINE 10 % EX SWAB: 2.0000 | Freq: Once | CUTANEOUS | Status: DC

## 2023-09-15 MED ORDER — PHENYLEPHRINE HCL-NACL 20-0.9 MG/250ML-% IV SOLN
INTRAVENOUS | Status: DC | PRN
Start: 2023-09-15 — End: 2023-09-15
  Administered 2023-09-15: 40 ug/min via INTRAVENOUS

## 2023-09-15 MED ORDER — WATER FOR IRRIGATION, STERILE IR SOLN
Status: DC | PRN
  Administered 2023-09-15: 2000 mL

## 2023-09-15 MED ORDER — 0.9 % SODIUM CHLORIDE (POUR BTL) OPTIME
TOPICAL | Status: DC | PRN
  Administered 2023-09-15: 1000 mL

## 2023-09-15 MED ORDER — GLYCOPYRROLATE 0.2 MG/ML IJ SOLN
INTRAMUSCULAR | Status: AC
  Filled 2023-09-15: qty 1

## 2023-09-15 MED ORDER — ACETAMINOPHEN 325 MG PO TABS
ORAL_TABLET | ORAL | Status: AC
  Filled 2023-09-15: qty 2

## 2023-09-15 MED ORDER — BUPIVACAINE LIPOSOME 1.3 % IJ SUSP
INTRAMUSCULAR | Status: AC
  Filled 2023-09-15: qty 20

## 2023-09-15 MED ORDER — ACETAMINOPHEN 500 MG PO TABS
1000.0000 mg | ORAL_TABLET | Freq: Four times a day (QID) | ORAL | Status: DC
Start: 2023-09-15 — End: 2023-09-15
  Administered 2023-09-15: 1000 mg via ORAL

## 2023-09-15 SURGICAL SUPPLY — 63 items
BAG COUNTER SPONGE SURGICOUNT (BAG) IMPLANT
BAG ZIPLOCK 12X15 (MISCELLANEOUS) ×1 IMPLANT
BIT DRILL TRIDENT 4X25 SU (BIT) IMPLANT
BLADE SAW SAG 25X90X1.19 (BLADE) ×1 IMPLANT
CEMENT BONE SIMPLEX SPEEDSET (Cement) IMPLANT
CHLORAPREP W/TINT 26 (MISCELLANEOUS) ×2 IMPLANT
CNTNR URN SCR LID CUP LEK RST (MISCELLANEOUS) ×1 IMPLANT
COVER SURGICAL LIGHT HANDLE (MISCELLANEOUS) ×1 IMPLANT
DERMABOND ADVANCED .7 DNX12 (GAUZE/BANDAGES/DRESSINGS) ×2 IMPLANT
DRAPE HIP W/POCKET STRL (MISCELLANEOUS) ×1 IMPLANT
DRAPE INCISE IOBAN 85X60 (DRAPES) ×2 IMPLANT
DRAPE POUCH INSTRU U-SHP 10X18 (DRAPES) ×1 IMPLANT
DRAPE SHEET LG 3/4 BI-LAMINATE (DRAPES) ×3 IMPLANT
DRAPE U-SHAPE 47X51 STRL (DRAPES) ×2 IMPLANT
DRSG AQUACEL AG ADV 3.5X10 (GAUZE/BANDAGES/DRESSINGS) ×1 IMPLANT
ELECT BLADE TIP CTD 4 INCH (ELECTRODE) ×1 IMPLANT
ELECT REM PT RETURN 15FT ADLT (MISCELLANEOUS) ×1 IMPLANT
GAUZE SPONGE 4X4 12PLY STRL (GAUZE/BANDAGES/DRESSINGS) ×1 IMPLANT
GLOVE BIO SURGEON STRL SZ 6.5 (GLOVE) ×2 IMPLANT
GLOVE BIOGEL PI IND STRL 6.5 (GLOVE) ×1 IMPLANT
GLOVE BIOGEL PI IND STRL 8 (GLOVE) ×1 IMPLANT
GLOVE SURG ORTHO 8.0 STRL STRW (GLOVE) ×2 IMPLANT
GOWN STRL REUS W/ TWL XL LVL3 (GOWN DISPOSABLE) ×2 IMPLANT
HEAD CERAMIC FEMORAL 36MM (Head) IMPLANT
HOLDER FOLEY CATH W/STRAP (MISCELLANEOUS) ×1 IMPLANT
HOOD PEEL AWAY T7 (MISCELLANEOUS) ×3 IMPLANT
INSERT 0 DEGREE 36 (Miscellaneous) IMPLANT
KIT BASIN OR (CUSTOM PROCEDURE TRAY) ×1 IMPLANT
KIT TURNOVER KIT A (KITS) IMPLANT
MANIFOLD NEPTUNE II (INSTRUMENTS) ×1 IMPLANT
MARKER SKIN DUAL TIP RULER LAB (MISCELLANEOUS) ×1 IMPLANT
NDL SAFETY ECLIPSE 18X1.5 (NEEDLE) ×2 IMPLANT
NS IRRIG 1000ML POUR BTL (IV SOLUTION) ×1 IMPLANT
PACK TOTAL JOINT (CUSTOM PROCEDURE TRAY) ×1 IMPLANT
PAD ARMBOARD POSITIONER FOAM (MISCELLANEOUS) ×1 IMPLANT
PENCIL SMOKE EVACUATOR (MISCELLANEOUS) IMPLANT
PROTECTOR NERVE ULNAR (MISCELLANEOUS) IMPLANT
RETRIEVER SUT HEWSON (MISCELLANEOUS) ×1 IMPLANT
SCREW HEX LP 6.5X20 (Screw) IMPLANT
SEALER BIPOLAR AQUA 6.0 (INSTRUMENTS) IMPLANT
SET HNDPC FAN SPRY TIP SCT (DISPOSABLE) IMPLANT
SHELL TRIDENT II CLUST 50 (Shell) IMPLANT
SHIELD FACE FULL FLUID (MISCELLANEOUS) ×1 IMPLANT
SOLUTION IRRIG SURGIPHOR (IV SOLUTION) IMPLANT
SOLUTION PRONTOSAN WOUND 350ML (IRRIGATION / IRRIGATOR) IMPLANT
SPIKE FLUID TRANSFER (MISCELLANEOUS) ×1 IMPLANT
STEM HIP 127 DEG (Stem) IMPLANT
SUCTION TUBE FRAZIER 12FR DISP (SUCTIONS) ×1 IMPLANT
SUT BONE WAX W31G (SUTURE) ×1 IMPLANT
SUT ETHIBOND #5 BRAIDED 30INL (SUTURE) ×1 IMPLANT
SUT MNCRL AB 3-0 PS2 18 (SUTURE) ×1 IMPLANT
SUT STRATAFIX 14 PDO 48 VLT (SUTURE) ×1 IMPLANT
SUT VIC AB 0 CT1 36 (SUTURE) IMPLANT
SUT VIC AB 2-0 CT2 27 (SUTURE) ×2 IMPLANT
SUTURE STRATFX 0 PDS 27 VIOLET (SUTURE) ×1 IMPLANT
SYR 30ML LL (SYRINGE) ×1 IMPLANT
SYR 50ML LL SCALE MARK (SYRINGE) ×1 IMPLANT
TOWEL OR 17X26 10 PK STRL BLUE (TOWEL DISPOSABLE) ×1 IMPLANT
TOWER CARTRIDGE SMART MIX (DISPOSABLE) IMPLANT
TRAY FOLEY MTR SLVR 16FR STAT (SET/KITS/TRAYS/PACK) IMPLANT
TUBE SUCTION HIGH CAP CLEAR NV (SUCTIONS) ×1 IMPLANT
UNDERPAD 30X36 HEAVY ABSORB (UNDERPADS AND DIAPERS) ×1 IMPLANT
WATER STERILE IRR 1000ML POUR (IV SOLUTION) ×2 IMPLANT

## 2023-09-15 NOTE — Transfer of Care (Signed)
 Immediate Anesthesia Transfer of Care Note  Patient: Douglas Turner  Procedure(s) Performed: Procedure(s): ARTHROPLASTY, HIP, TOTAL,POSTERIOR APPROACH (Left)  Patient Location: PACU  Anesthesia Type:MAC and Spinal  Level of Consciousness: Patient easily awoken, comfortable, cooperative, following commands, responds to stimulation.   Airway & Oxygen Therapy: Patient spontaneously breathing, ventilating well, oxygen via simple oxygen mask.  Post-op Assessment: Report given to PACU RN, vital signs reviewed and stable.   Post vital signs: Reviewed and stable.  Complications: No apparent anesthesia complications  Last Vitals:  Vitals Value Taken Time  BP 118/60 09/15/23 0941  Temp    Pulse 46 09/15/23 0942  Resp 12 09/15/23 0942  SpO2 100 % 09/15/23 0942  Vitals shown include unfiled device data.  Last Pain:  Vitals:   09/15/23 0607  TempSrc: Oral  PainSc: 5          Complications: No notable events documented.

## 2023-09-15 NOTE — Anesthesia Procedure Notes (Signed)
 Spinal  Patient location during procedure: OR Start time: 09/15/2023 7:41 AM End time: 09/15/2023 7:46 AM Reason for block: surgical anesthesia Staffing Performed: anesthesiologist  Anesthesiologist: Earvin Goldberg, MD Performed by: Earvin Goldberg, MD Authorized by: Earvin Goldberg, MD   Preanesthetic Checklist Completed: patient identified, IV checked, site marked, risks and benefits discussed, surgical consent, monitors and equipment checked, pre-op evaluation and timeout performed Spinal Block Patient position: sitting Prep: DuraPrep Patient monitoring: heart rate, cardiac monitor, continuous pulse ox and blood pressure Approach: midline Location: L3-4 Injection technique: single-shot Needle Needle type: Sprotte  Needle gauge: 24 G Needle length: 9 cm Assessment Sensory level: T4 Events: CSF return

## 2023-09-15 NOTE — Discharge Instructions (Addendum)
 INSTRUCTIONS AFTER JOINT REPLACEMENT   Remove items at home which could result in a fall. This includes throw rugs or furniture in walking pathways ICE to the affected joint every three hours while awake for 30 minutes at a time, for at least the first 3-5 days, and then as needed for pain and swelling.  Continue to use ice for pain and swelling. You may notice swelling that will progress down to the foot and ankle.  This is normal after surgery.  Elevate your leg when you are not up walking on it.   Continue to use the breathing machine you got in the hospital (incentive spirometer) which will help keep your temperature down.  It is common for your temperature to cycle up and down following surgery, especially at night when you are not up moving around and exerting yourself.  The breathing machine keeps your lungs expanded and your temperature down.  DIET:  As you were doing prior to hospitalization, we recommend a well-balanced diet.  DRESSING / WOUND CARE / SHOWERING:  Keep the surgical dressing until follow up.  The dressing is water proof, so you can shower without any extra covering.  IF THE DRESSING FALLS OFF or the wound gets wet inside, change the dressing with sterile gauze.  Please use good hand washing techniques before changing the dressing.  Do not use any lotions or creams on the incision until instructed by your surgeon.    ACTIVITY  Increase activity slowly as tolerated, but follow the weight bearing instructions below.   No driving for 6 weeks or until further direction given by your physician.  You cannot drive while taking narcotics.  No lifting or carrying greater than 10 lbs. until further directed by your surgeon. Avoid periods of inactivity such as sitting longer than an hour when not asleep. This helps prevent blood clots.  You may return to work once you are authorized by your doctor.   WEIGHT BEARING: Weight bearing as tolerated with assist device (walker, cane, etc) as  directed, use it as long as suggested by your surgeon or therapist, typically at least 4-6 weeks.  EXERCISES  Results after joint replacement surgery are often greatly improved when you follow the exercise, range of motion and muscle strengthening exercises prescribed by your doctor. Safety measures are also important to protect the joint from further injury. Any time any of these exercises cause you to have increased pain or swelling, decrease what you are doing until you are comfortable again and then slowly increase them. If you have problems or questions, call your caregiver or physical therapist for advice.   Rehabilitation is important following a joint replacement. After just a few days of immobilization, the muscles of the leg can become weakened and shrink (atrophy).  These exercises are designed to build up the tone and strength of the thigh and leg muscles and to improve motion. Often times heat used for twenty to thirty minutes before working out will loosen up your tissues and help with improving the range of motion but do not use heat for the first two weeks following surgery (sometimes heat can increase post-operative swelling).   These exercises can be done on a training (exercise) mat, on the floor, on a table or on a bed. Use whatever works the best and is most comfortable for you.    Use music or television while you are exercising so that the exercises are a pleasant break in your day. This will make your life  better with the exercises acting as a break in your routine that you can look forward to.   Perform all exercises about fifteen times, three times per day or as directed.  You should exercise both the operative leg and the other leg as well.  Exercises include:   Quad Sets - Tighten up the muscle on the front of the thigh (Quad) and hold for 5-10 seconds.   Straight Leg Raises - With your knee straight (if you were given a brace, keep it on), lift the leg to 60 degrees, hold  for 3 seconds, and slowly lower the leg.  Perform this exercise against resistance later as your leg gets stronger.  Leg Slides: Lying on your back, slowly slide your foot toward your buttocks, bending your knee up off the floor (only go as far as is comfortable). Then slowly slide your foot back down until your leg is flat on the floor again.  Angel Wings: Lying on your back spread your legs to the side as far apart as you can without causing discomfort.  Hamstring Strength:  Lying on your back, push your heel against the floor with your leg straight by tightening up the muscles of your buttocks.  Repeat, but this time bend your knee to a comfortable angle, and push your heel against the floor.  You may put a pillow under the heel to make it more comfortable if necessary.   A rehabilitation program following joint replacement surgery can speed recovery and prevent re-injury in the future due to weakened muscles. Contact your doctor or a physical therapist for more information on knee rehabilitation.   CONSTIPATION:  Constipation is defined medically as fewer than three stools per week and severe constipation as less than one stool per week.  Even if you have a regular bowel pattern at home, your normal regimen is likely to be disrupted due to multiple reasons following surgery.  Combination of anesthesia, postoperative narcotics, change in appetite and fluid intake all can affect your bowels.   YOU MUST use at least one of the following options; they are listed in order of increasing strength to get the job done.  They are all available over the counter, and you may need to use some, POSSIBLY even all of these options:    Drink plenty of fluids (prune juice may be helpful) and high fiber foods Colace 100 mg by mouth twice a day  Senokot for constipation as directed and as needed Dulcolax (bisacodyl), take with full glass of water   Miralax  (polyethylene glycol) once or twice a day as needed.  If you  have tried all these things and are unable to have a bowel movement in the first 3-4 days after surgery call either your surgeon or your primary doctor.    If you experience loose stools or diarrhea, hold the medications until you stool forms back up.  If your symptoms do not get better within 1 week or if they get worse, check with your doctor.  If you experience "the worst abdominal pain ever" or develop nausea or vomiting, please contact the office immediately for further recommendations for treatment.  ITCHING:  If you experience itching with your medications, try taking only a single pain pill, or even half a pain pill at a time.  You can also use Benadryl over the counter for itching or also to help with sleep.   TED HOSE STOCKINGS:  Use stockings on both legs until for at least 2 weeks or  as directed by physician office. They may be removed at night for sleeping.  MEDICATIONS:  See your medication summary on the "After Visit Summary" that nursing will review with you.  You may have some home medications which will be placed on hold until you complete the course of blood thinner medication.  It is important for you to complete the blood thinner medication as prescribed.  Blood clot prevention (DVT Prophylaxis): After surgery you are at an increased risk for a blood clot.  You were prescribed a blood thinner, Aspirin 81mg , to be taken twice daily for a total of 4 weeks from surgery to help reduce your risk of getting a blood clot.  Signs of a pulmonary embolus (blood clot in the lungs) include sudden short of breath, feeling lightheaded or dizzy, chest pain with a deep breath, rapid pulse rapid breathing.  Signs of a blood clot in your arms or legs include new unexplained swelling and cramping, warm, red or darkened skin around the painful area.  Please call the office or 911 right away if these signs or symptoms develop.  PRECAUTIONS:   If you experience chest pain or shortness of breath - call  911 immediately for transfer to the hospital emergency department.   If you develop a fever greater that 101 F, purulent drainage from wound, increased redness or drainage from wound, foul odor from the wound/dressing, or calf pain - CONTACT YOUR SURGEON.                                                   FOLLOW-UP APPOINTMENTS:  If you do not already have a post-op appointment, please call the office for an appointment to be seen by your surgeon.  Guidelines for how soon to be seen are listed in your "After Visit Summary", but are typically between 2-3 weeks after surgery.  If you have a specialized bandage, you may be told to follow up 1 week after surgery.  POST-OPERATIVE PAIN MEDICINE INSTRUCTIONS: You are to resume your suboxone after discharge, and contact your regular prescriber regarding refills and management of this medication specifically. Additional pain is to be managed with Tylenol, NSAIDs (Mobic), and a muscle relaxant (Robaxin). Do Not substitute Alcohol to help with pain management.  MAKE SURE YOU:  Understand these instructions.  Get help right away if you are not doing well or get worse.    Thank you for letting us  be a part of your medical care team.  It is a privilege we respect greatly.  We hope these instructions will help you stay on track for a fast and full recovery!

## 2023-09-15 NOTE — Anesthesia Preprocedure Evaluation (Signed)
 Anesthesia Evaluation  Patient identified by MRN, date of birth, ID band Patient awake    Reviewed: Allergy & Precautions, H&P , NPO status , Patient's Chart, lab work & pertinent test results  Airway Mallampati: II  TM Distance: >3 FB Neck ROM: Full    Dental no notable dental hx.    Pulmonary neg pulmonary ROS, former smoker   Pulmonary exam normal breath sounds clear to auscultation       Cardiovascular negative cardio ROS Normal cardiovascular exam Rhythm:Regular Rate:Normal     Neuro/Psych negative neurological ROS  negative psych ROS   GI/Hepatic Neg liver ROS,GERD  ,,  Endo/Other  negative endocrine ROS    Renal/GU negative Renal ROS  negative genitourinary   Musculoskeletal negative musculoskeletal ROS (+)    Abdominal   Peds negative pediatric ROS (+)  Hematology negative hematology ROS (+)   Anesthesia Other Findings   Reproductive/Obstetrics negative OB ROS                             Anesthesia Physical Anesthesia Plan  ASA: 2  Anesthesia Plan: Spinal   Post-op Pain Management: Tylenol PO (pre-op)*   Induction: Intravenous  PONV Risk Score and Plan: 1 and Propofol infusion and Treatment may vary due to age or medical condition  Airway Management Planned: Simple Face Mask  Additional Equipment:   Intra-op Plan:   Post-operative Plan:   Informed Consent: I have reviewed the patients History and Physical, chart, labs and discussed the procedure including the risks, benefits and alternatives for the proposed anesthesia with the patient or authorized representative who has indicated his/her understanding and acceptance.     Dental advisory given  Plan Discussed with: CRNA  Anesthesia Plan Comments:        Anesthesia Quick Evaluation

## 2023-09-15 NOTE — Evaluation (Signed)
 Physical Therapy Evaluation Patient Details Name: Douglas Turner MRN: [id number] DOB: 13-Aug-1990 Today's Date: 09/15/2023  History of Present Illness  33 yo male presents to therapy s/p L THA on 09/15/2023 due to avascular necrosis and failure of conservative measures. Posterior lateral approach and L LE WBAT and no formal hip precautions.  Pt PMH includes but is not limited to: tobacco abuse.  Clinical Impression    Douglas Turner is a 33 y.o. male POD 0 s/p L THA. Patient reports IND with mobility at baseline. Patient is now limited by functional impairments (see PT problem list below) and requires CGA and cues for transfers and gait with RW. Patient was able to ambulate 50 and 45 feet with RW and CGA and cues for safe walker management. Patient educated on safe sequencing for stair mobility with use of B UE support on L handrail, pain management, fall risk prevention, use of CP/ice, and car transfers pt and family verbalized understanding of safe guarding position for people assisting with mobility. Patient instructed in exercises to facilitate ROM and circulation reviewed and HO provided. Patient will benefit from continued skilled PT interventions to address impairments and progress towards PLOF. Patient has met mobility goals at adequate level for discharge home with family support and OPPT services; will continue to follow if pt continues acute stay to progress towards Mod I goals.       If plan is discharge home, recommend the following: A little help with walking and/or transfers;A little help with bathing/dressing/bathroom;Assistance with cooking/housework;Assist for transportation;Help with stairs or ramp for entrance   Can travel by private vehicle        Equipment Recommendations Rolling walker (2 wheels)  Recommendations for Other Services       Functional Status Assessment Patient has had a recent decline in their functional status and demonstrates the ability to make  significant improvements in function in a reasonable and predictable amount of time.     Precautions / Restrictions Precautions Precautions: Fall Restrictions Weight Bearing Restrictions Per Provider Order: No      Mobility  Bed Mobility Overal bed mobility: Needs Assistance Bed Mobility: Supine to Sit     Supine to sit: HOB elevated, Min assist     General bed mobility comments: min a for initation of L LE to EOB and cues    Transfers Overall transfer level: Needs assistance Equipment used: Rolling walker (2 wheels) Transfers: Sit to/from Stand Sit to Stand: Contact guard assist           General transfer comment: min cues for proper UE and AD placement    Ambulation/Gait Ambulation/Gait assistance: Contact guard assist Gait Distance (Feet): 50 Feet Assistive device: Rolling walker (2 wheels) Gait Pattern/deviations: Step-to pattern, Antalgic, Trunk flexed Gait velocity: decreased, required cues     General Gait Details: slight trunk flexion with B UE support at Rw to offload L LE in stance phase, pt has R hip dysfunction and now presents with leg length discrepancy L > R with lateral sway and pt intermittently vaulting/PF on R to clear L LE in swing phase. pt required min cues for safety and RW management  Stairs Stairs: Yes Stairs assistance: Contact guard assist Stair Management: One rail Left, Two rails Number of Stairs: 3 General stair comments: step navigation initated with B handrails and cues for safety technique and sequencing pt able to progress to B UE support on L handrail to emulate home setting, spouse indicated may be able to enter home  from the back and no steps  Wheelchair Mobility     Tilt Bed    Modified Rankin (Stroke Patients Only)       Balance Overall balance assessment: Needs assistance Sitting-balance support: Feet supported Sitting balance-Leahy Scale: Good     Standing balance support: Bilateral upper extremity supported,  During functional activity, Reliant on assistive device for balance Standing balance-Leahy Scale: Fair Standing balance comment: static standing no UE support                             Pertinent Vitals/Pain Pain Assessment Pain Assessment: 0-10 Pain Score: 10-Worst pain ever Pain Location: L hip and LE Pain Descriptors / Indicators: Aching, Constant, Discomfort, Dull, Grimacing, Burning, Operative site guarding Pain Intervention(s): Limited activity within patient's tolerance, Monitored during session, Premedicated before session, Repositioned, Ice applied    Home Living Family/patient expects to be discharged to:: Private residence Living Arrangements: Spouse/significant other;Children Available Help at Discharge: Family Type of Home: House Home Access: Stairs to enter Entrance Stairs-Rails: None (can grab support) Entrance Stairs-Number of Steps: 2   Home Layout: One level Home Equipment: Cane - single point      Prior Function Prior Level of Function : Independent/Modified Independent;Working/employed;Driving             Mobility Comments: IND with all ADLs, self care tasks, IADLs       Extremity/Trunk Assessment        Lower Extremity Assessment Lower Extremity Assessment: LLE deficits/detail LLE Deficits / Details: ankle DF 4/5 and PF 5/5 LLE Sensation: WNL    Cervical / Trunk Assessment Cervical / Trunk Assessment: Normal  Communication   Communication Communication: No apparent difficulties    Cognition Arousal: Alert Behavior During Therapy: WFL for tasks assessed/performed   PT - Cognitive impairments: No apparent impairments                         Following commands: Intact       Cueing       General Comments      Exercises Total Joint Exercises Ankle Circles/Pumps: AROM, Both, 10 reps Quad Sets: AROM, Left, 5 reps Heel Slides: AROM, Left, 5 reps Hip ABduction/ADduction: AROM, Left, 5 reps, Standing Long  Arc Quad: AROM, Left, 5 reps, Seated Knee Flexion: AROM, Left, 5 reps, Standing Standing Hip Extension: AROM, Left, 5 reps, Standing   Assessment/Plan    PT Assessment Patient needs continued PT services  PT Problem List Decreased strength;Decreased range of motion;Decreased activity tolerance;Decreased balance;Decreased mobility;Decreased coordination;Pain       PT Treatment Interventions DME instruction;Gait training;Stair training;Functional mobility training;Therapeutic activities;Therapeutic exercise;Balance training;Patient/family education;Modalities;Neuromuscular re-education    PT Goals (Current goals can be found in the Care Plan section)  Acute Rehab PT Goals Patient Stated Goal: to be able to run again. get the R hip done PT Goal Formulation: With patient Time For Goal Achievement: 09/29/23 Potential to Achieve Goals: Good    Frequency 7X/week     Co-evaluation               AM-PAC PT "6 Clicks" Mobility  Outcome Measure Help needed turning from your back to your side while in a flat bed without using bedrails?: None Help needed moving from lying on your back to sitting on the side of a flat bed without using bedrails?: A Little Help needed moving to and from a bed to a chair (including  a wheelchair)?: A Little Help needed standing up from a chair using your arms (e.g., wheelchair or bedside chair)?: A Little Help needed to walk in hospital room?: A Little Help needed climbing 3-5 steps with a railing? : A Little 6 Click Score: 19    End of Session Equipment Utilized During Treatment: Gait belt Activity Tolerance: Patient tolerated treatment well;Other (comment) (no change in pain remained 10/10 s/p IV pain medication administered at begining of eval) Patient left: in chair;with call bell/phone within reach;with family/visitor present Nurse Communication: Mobility status;Other (comment) (pt readiness for d/c from PT standpoint) PT Visit Diagnosis:  Unsteadiness on feet (R26.81);Other abnormalities of gait and mobility (R26.89);Muscle weakness (generalized) (M62.81);Difficulty in walking, not elsewhere classified (R26.2);Pain Pain - Right/Left: Left Pain - part of body: Leg;Hip    Time: 8295-6213 PT Time Calculation (min) (ACUTE ONLY): 54 min   Charges:   PT Evaluation $PT Eval Low Complexity: 1 Low PT Treatments $Gait Training: 8-22 mins $Therapeutic Exercise: 8-22 mins $Therapeutic Activity: 8-22 mins PT General Charges $$ ACUTE PT VISIT: 1 Visit         Cary Clarks, PT Acute Rehab   Annalee Kiang 09/15/2023, 4:01 PM

## 2023-09-15 NOTE — Anesthesia Postprocedure Evaluation (Signed)
 Anesthesia Post Note  Patient: Douglas Turner  Procedure(s) Performed: ARTHROPLASTY, HIP, TOTAL,POSTERIOR APPROACH (Left: Hip)     Patient location during evaluation: PACU Anesthesia Type: Spinal Level of consciousness: awake and alert Pain management: pain level controlled Vital Signs Assessment: post-procedure vital signs reviewed and stable Respiratory status: spontaneous breathing, nonlabored ventilation and respiratory function stable Cardiovascular status: blood pressure returned to baseline and stable Postop Assessment: no apparent nausea or vomiting Anesthetic complications: no   No notable events documented.  Last Vitals:  Vitals:   09/15/23 1100 09/15/23 1115  BP: 97/65 (!) 105/56  Pulse: (!) 53 (!) 59  Resp: 15 11  Temp: 36.4 C   SpO2: 99% 98%    Last Pain:  Vitals:   09/15/23 1100  TempSrc:   PainSc: 0-No pain                 Earvin Goldberg

## 2023-09-15 NOTE — Interval H&P Note (Signed)
The patient has been re-examined, and the chart reviewed, and there have been no interval changes to the documented history and physical.    Plan for L THA for left hip OA  The operative side was examined and the patient was confirmed to have sensation to DPN, SPN, TN intact, Motor EHL, ext, flex 5/5, and DP 2+, PT 2+, No significant edema.   The risks, benefits, and alternatives have been discussed at length with patient, and the patient is willing to proceed.  Left hip marked. Consent has been signed.  

## 2023-09-15 NOTE — Anesthesia Procedure Notes (Signed)
 Procedure Name: MAC Date/Time: 09/15/2023 7:35 AM  Performed by: Manuela Sella, CRNAPre-anesthesia Checklist: Patient identified, Emergency Drugs available, Suction available and Patient being monitored Patient Re-evaluated:Patient Re-evaluated prior to induction Oxygen Delivery Method: Simple face mask Placement Confirmation: positive ETCO2 and breath sounds checked- equal and bilateral Dental Injury: Teeth and Oropharynx as per pre-operative assessment

## 2023-09-15 NOTE — Progress Notes (Signed)
 Family asking if patient can be admitted for pain control writer paged MD for further recommendations/orders.

## 2023-09-15 NOTE — Op Note (Signed)
 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  The risks benefits and alternatives were discussed with the patient including but not limited to the risks of nonoperative treatment, versus surgical intervention including infection, bleeding, nerve injury, periprosthetic fracture, the need for revision surgery, dislocation, leg length discrepancy, blood clots, cardiopulmonary complications, morbidity, mortality, among others, and they were willing to proceed.     OPERATIVE REPORT     SURGEON:  Priscille Brought, MD    ASSISTANT: Mason Sole, PA-C, (Present throughout the entire procedure,  necessary for completion of procedure in a timely manner, assisting with retraction, instrumentation, and closure)     ANESTHESIA:  spinal  ESTIMATED BLOOD LOSS: 250cc    COMPLICATIONS:  None.    COMPONENTS:   Stryker Trident 2 50mm acetabular shell, 6.5 hex screws x 2, neutral X3 polyethylene insert alpha code D, Accolade 2 size 5 stem with 127 degree neck angle, 36+0 mm ceramic head Implant Name Type Inv. Item Serial No. Manufacturer Lot No. LRB No. Used Action  SHELL TRIDENT II CLUST 50 - [id number] Shell SHELL TRIDENT II CLUST 50  [employer] [id number] Left 1 Implanted  SCREW HEX LP 6.5X20 - [id number] Screw SCREW HEX LP 6.5X20  [employer] [id number] Left 1 Implanted  SCREW HEX LP 6.5X20 - [id number] Screw SCREW HEX LP 6.5X20  [employer] LZEA Left 1 Implanted  INSERT 0 DEGREE 36 - [id number] Miscellaneous INSERT 0 DEGREE 36  [employer] [id number] Left 1 Implanted  STEM  HIP 127 DEG - [id number] Stem STEM HIP 127 DEG  [employer] [id number] A Left 1 Implanted  HEAD CERAMIC FEMORAL - [id number] Head HEAD CERAMIC FEMORAL  [employer] [id number] Left 1 Implanted     PROCEDURE IN DETAIL:   The patient was met in the holding area and  identified.  The appropriate hip was identified and marked at the operative site.  The patient was then transported to the OR  and  placed under anesthesia.  At that point, the patient was  placed in the lateral decubitus position with the operative side up and  secured to the operating room table  and all bony prominences padded. A subaxillary role was also placed.    The operative lower extremity was prepped from the iliac crest to the distal leg.  Sterile draping was performed.  Preoperative antibiotics, 2 gm of ancef,1 gm of Tranexamic Acid, and 8 mg of Decadron administered. Time out was performed prior to incision.      A routine posterolateral approach was utilized via sharp dissection  carried down to the subcutaneous tissue.  Gross bleeders were Bovie coagulated.  The iliotibial band was identified and incised along the length of the skin incision through the glute max fascia.  Charnley retractor was placed with care to protect the sciatic nerve posteriorly.  With the hip internally rotated, the piriformis tendon was identified and released from the femoral insertion and tagged with a #5 Ethibond.  A capsulotomy was then performed off the femoral insertion and also tagged with a #5 Ethibond.    The femoral neck was exposed, and I resected the femoral  neck based on preoperative templating relative to the lesser trochanter.    I then exposed the deep acetabulum, cleared out any tissue including the ligamentum teres.  After adequate visualization, I excised the labrum.  I then started reaming with a 46 mm reamer, first medializing to the floor of the cotyloid fossa, and then in the position of the cup aiming towards  the greater sciatic notch, matching the version of the transverse acetabular ligament and tucked under the anterior wall. I reamed up to 50 mm reamer with good bony bed preparation and a 50 mm cup was chosen.  The real cup was then impacted into place.  Appropriate version and inclination was confirmed clinically matching their bony anatomy, and also with the use of the jig.  I placed 2 screws in the posterior superior quadrant to augment fixation.  A neutral liner was placed and impacted. It was confirmed to be appropriately seated and the acetabular retractors were removed.    I then prepared the proximal femur using the box cutter, Charnley awl, and then sequentially broached starting with 0 up to a size 5.  A trial broach, neck, and head was utilized, and I reduced the hip and it was found to have excellent stability.  There was no impingement with full extension and 90 degrees external rotation.  The hip was stable at the position of sleep and with 90 degrees flexion and 80 degrees of internal rotation.  Leg lengths were also clinically assessed in the lateral position and felt to be equal. Intra-Op flatplate was obtained and confirmed appropriate component positions.  Good fill of the femur with the size 5 broach.  And restoration of leg length and offset. No evidence or concern for fracture.  A final femoral prosthesis size 5 was selected. I then impacted the real femoral prosthesis into place.I again trialed and selected a 36+ 0mm ball. The hip was then reduced and taken through a range of motion. There was no impingement with full extension and 90 degrees external rotation.  The hip was stable at the position of sleep and with 90 degrees flexion and 80 degrees of internal rotation. Leg lengths were  again assessed and felt to be restored.  We then opened, and I impacted the real head ball into place.  The posterior capsule was then closed with #5 Ethibond.  The piriformis was repaired through the  base of the abductor tendon using a [place] suture passer.  I then irrigated the hip copiously with dilute Betadine and with normal saline pulse lavage. Periarticular injection was then performed with Exparel.   We repaired the fascia #1 barbed suture, followed by 0 barbed suture for the subcutaneous fat.  Skin was closed with 2-0 Vicryl and 3-0 Monocryl.  Dermabond and Aquacel dressing were applied. The patient was then awakened and returned to PACU in stable and satisfactory condition.  Leg lengths in the supine position were assessed and felt to be clinically equal. There were no complications.  Post op recs: WB: WBAT LLE, No formal hip precautions Abx: ancef Imaging: PACU pelvis Xray Dressing: Aquacell, keep intact until follow up DVT prophylaxis: Aspirin 81BID starting POD1 Follow up: 2 weeks after surgery for a wound check with Dr. Pryor Browning at [facility].  Address: [address], [place], [place] [address]  Office Phone: [phone]   Priscille Brought, MD Orthopedic Surgeon

## 2023-09-16 ENCOUNTER — Encounter (HOSPITAL_COMMUNITY): Payer: Self-pay | Admitting: Orthopedic Surgery

## 2023-10-07 NOTE — Progress Notes (Signed)
 Sent message, via epic in basket, requesting orders in epic from Careers adviser.

## 2023-10-12 ENCOUNTER — Ambulatory Visit: Payer: Self-pay | Admitting: Emergency Medicine

## 2023-10-12 DIAGNOSIS — G8929 Other chronic pain: Secondary | ICD-10-CM

## 2023-10-12 NOTE — H&P (View-Only) (Signed)
 TOTAL HIP ADMISSION H&P  Patient is admitted for right total hip arthroplasty.  Subjective:  Chief Complaint: right hip pain  HPI: Douglas Turner, 33 y.o. 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  Onset of symptoms was gradual starting 1 years ago with rapidlly worsening course since that time.The patient noted no past surgery on the right hip(s).  Patient currently rates pain in the right hip at 10 out of 10 with activity. Patient has night pain, worsening of pain with activity and weight bearing, trendelenberg gait, pain that interfers with activities of daily living, and pain with passive range of motion. Patient has evidence of avascular necrosis by imaging studies. This condition presents safety issues increasing the risk of falls.  There is no current active infection.  There are no active problems to display for this patient.  Past Medical History:  Diagnosis Date   GERD (gastroesophageal reflux disease)    Pneumonia    As a child    Past Surgical History:  Procedure Laterality Date   HERNIA REPAIR     As a baby   TOTAL HIP ARTHROPLASTY Left 09/15/2023   Procedure: ARTHROPLASTY, HIP, TOTAL,POSTERIOR APPROACH;  Surgeon: Edna Toribio LABOR, MD;  Location: [facility];  Service: Orthopedics;  Laterality: Left;   WISDOM TOOTH EXTRACTION      Current Outpatient Medications  Medication Sig Dispense Refill Last Dose/Taking   acetaminophen  (TYLENOL ) 500 MG tablet Take 2 tablets (1,000 mg total) by mouth every 8 (eight) hours as needed.      aspirin  EC 81 MG tablet Take 1 tablet (81 mg total) by mouth 2 (two) times daily for 28 days. Swallow whole.      Buprenorphine HCl-Naloxone HCl 8-2 MG FILM Place 0.25 Film under the tongue  daily.      loratadine (CLARITIN) 10 MG tablet Take 10 mg by mouth daily.      meloxicam  (MOBIC ) 15 MG tablet Take 1 tablet (15 mg total) by mouth daily. 30 tablet 0    omeprazole (PRILOSEC) 40 MG capsule Take 40 mg by mouth daily.      polyethylene glycol (MIRALAX ) 17 g packet Take 17 g by mouth daily. 14 each 0    No current facility-administered medications for this visit.   Allergies  Allergen Reactions   Iodine  Shortness Of Breath, Swelling and Rash   Shellfish Allergy Anaphylaxis    Social History   Tobacco Use   Smoking status: Former    Types: Cigarettes   Smokeless tobacco: Current    Types: Chew   Tobacco comments:    Smoked 3 years quit 3 months ago  Substance Use Topics   Alcohol  use: Not Currently    No family history on file.   Review of Systems  Musculoskeletal:  Positive for arthralgias.  All other systems reviewed and are negative.   Objective:  Physical Exam Constitutional:      General: He is not in acute distress.    Appearance: Normal appearance. He is normal weight.  HENT:     Head: Normocephalic and atraumatic.   Eyes:     Extraocular Movements: Extraocular movements intact.     Conjunctiva/sclera: Conjunctivae normal.     Pupils: Pupils are equal, round, and reactive to light.    Cardiovascular:  Rate and Rhythm: Normal rate and regular rhythm.     Pulses: Normal pulses.     Heart sounds: Normal heart sounds.  Pulmonary:     Effort: Pulmonary effort is normal. No respiratory distress.     Breath sounds: Normal breath sounds.  Abdominal:     General: Bowel sounds are normal. There is no distension.     Palpations: Abdomen is soft.     Tenderness: There is no abdominal tenderness.   Musculoskeletal:        General: Tenderness present.     Cervical back: Normal range of motion and neck supple.     Comments: TTP over groin, lateral aspect, greater trochanter.   No significant swelling.  No overlying lesions of area of chief  complaint.  Decreased strength and ROM due to elicited pain.  Dorsiflexion and plantarflexion intact.  BLE appear grossly neurovascularly intact.  Gait antalgic.   Lymphadenopathy:     Cervical: No cervical adenopathy.   Skin:    General: Skin is warm and dry.     Capillary Refill: Capillary refill takes less than 2 seconds.     Findings: No erythema or rash.   Neurological:     General: No focal deficit present.     Mental Status: He is alert and oriented to person, place, and time.   Psychiatric:        Mood and Affect: Mood normal.        Behavior: Behavior normal.     Vital signs in last 24 hours: @VSRANGES @  Labs:   Estimated body mass index is 27.4 kg/m as calculated from the following:   Height as of 09/15/23: 5' 6 (1.676 m).   Weight as of 09/15/23: 77 kg.   Imaging Review Plain radiographs demonstrate avascular  necrosis of the right hip(s). The bone quality appears to be fair for age and reported activity level.      Assessment/Plan:  Avascular necrosis, right hip(s)  The patient history, physical examination, clinical judgement of the provider and imaging studies are consistent with avascular necrosis of the right hip(s) and total hip arthroplasty is deemed medically necessary. The treatment options including medical management, injection therapy, arthroscopy and arthroplasty were discussed at length. The risks and benefits of total hip arthroplasty were presented and reviewed. The risks due to aseptic loosening, infection, stiffness, dislocation/subluxation,  thromboembolic complications and other imponderables were discussed.  The patient acknowledged the explanation, agreed to proceed with the plan and consent was signed. Patient is being admitted for inpatient treatment for surgery, pain control, PT, OT, prophylactic antibiotics, VTE prophylaxis, progressive ambulation and ADL's and discharge planning.The patient is planning to be discharged home with  outpatient PT.    Patient's anticipated LOS is less than 2 midnights, meeting these requirements: - Younger than 33 - Lives within 1 hour of care - Has a competent adult at home to recover with post-op recover - NO history of  - Chronic pain requiring opiods  - Diabetes  - Coronary Artery Disease  - Heart failure  - Heart attack  - Stroke  - DVT/VTE  - Cardiac arrhythmia  - Respiratory Failure/COPD  - Renal failure  - Anemia  - Advanced Liver disease

## 2023-10-12 NOTE — H&P (Signed)
 TOTAL HIP ADMISSION H&P  Patient is admitted for right total hip arthroplasty.  Subjective:  Chief Complaint: right hip pain  HPI: Douglas Turner, 33 y.o. 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  Onset of symptoms was gradual starting 1 years ago with rapidlly worsening course since that time.The patient noted no past surgery on the right hip(s).  Patient currently rates pain in the right hip at 10 out of 10 with activity. Patient has night pain, worsening of pain with activity and weight bearing, trendelenberg gait, pain that interfers with activities of daily living, and pain with passive range of motion. Patient has evidence of avascular necrosis by imaging studies. This condition presents safety issues increasing the risk of falls.  There is no current active infection.  There are no active problems to display for this patient.  Past Medical History:  Diagnosis Date   GERD (gastroesophageal reflux disease)    Pneumonia    As a child    Past Surgical History:  Procedure Laterality Date   HERNIA REPAIR     As a baby   TOTAL HIP ARTHROPLASTY Left 09/15/2023   Procedure: ARTHROPLASTY, HIP, TOTAL,POSTERIOR APPROACH;  Surgeon: Edna Toribio LABOR, MD;  Location: [facility];  Service: Orthopedics;  Laterality: Left;   WISDOM TOOTH EXTRACTION      Current Outpatient Medications  Medication Sig Dispense Refill Last Dose/Taking   acetaminophen  (TYLENOL ) 500 MG tablet Take 2 tablets (1,000 mg total) by mouth every 8 (eight) hours as needed.      aspirin  EC 81 MG tablet Take 1 tablet (81 mg total) by mouth 2 (two) times daily for 28 days. Swallow whole.      Buprenorphine HCl-Naloxone HCl 8-2 MG FILM Place 0.25 Film under the tongue  daily.      loratadine (CLARITIN) 10 MG tablet Take 10 mg by mouth daily.      meloxicam  (MOBIC ) 15 MG tablet Take 1 tablet (15 mg total) by mouth daily. 30 tablet 0    omeprazole (PRILOSEC) 40 MG capsule Take 40 mg by mouth daily.      polyethylene glycol (MIRALAX ) 17 g packet Take 17 g by mouth daily. 14 each 0    No current facility-administered medications for this visit.   Allergies  Allergen Reactions   Iodine  Shortness Of Breath, Swelling and Rash   Shellfish Allergy Anaphylaxis    Social History   Tobacco Use   Smoking status: Former    Types: Cigarettes   Smokeless tobacco: Current    Types: Chew   Tobacco comments:    Smoked 3 years quit 3 months ago  Substance Use Topics   Alcohol  use: Not Currently    No family history on file.   Review of Systems  Musculoskeletal:  Positive for arthralgias.  All other systems reviewed and are negative.   Objective:  Physical Exam Constitutional:      General: He is not in acute distress.    Appearance: Normal appearance. He is normal weight.  HENT:     Head: Normocephalic and atraumatic.   Eyes:     Extraocular Movements: Extraocular movements intact.     Conjunctiva/sclera: Conjunctivae normal.     Pupils: Pupils are equal, round, and reactive to light.    Cardiovascular:  Rate and Rhythm: Normal rate and regular rhythm.     Pulses: Normal pulses.     Heart sounds: Normal heart sounds.  Pulmonary:     Effort: Pulmonary effort is normal. No respiratory distress.     Breath sounds: Normal breath sounds.  Abdominal:     General: Bowel sounds are normal. There is no distension.     Palpations: Abdomen is soft.     Tenderness: There is no abdominal tenderness.   Musculoskeletal:        General: Tenderness present.     Cervical back: Normal range of motion and neck supple.     Comments: TTP over groin, lateral aspect, greater trochanter.   No significant swelling.  No overlying lesions of area of chief  complaint.  Decreased strength and ROM due to elicited pain.  Dorsiflexion and plantarflexion intact.  BLE appear grossly neurovascularly intact.  Gait antalgic.   Lymphadenopathy:     Cervical: No cervical adenopathy.   Skin:    General: Skin is warm and dry.     Capillary Refill: Capillary refill takes less than 2 seconds.     Findings: No erythema or rash.   Neurological:     General: No focal deficit present.     Mental Status: He is alert and oriented to person, place, and time.   Psychiatric:        Mood and Affect: Mood normal.        Behavior: Behavior normal.     Vital signs in last 24 hours: @VSRANGES @  Labs:   Estimated body mass index is 27.4 kg/m as calculated from the following:   Height as of 09/15/23: 5' 6 (1.676 m).   Weight as of 09/15/23: 77 kg.   Imaging Review Plain radiographs demonstrate avascular  necrosis of the right hip(s). The bone quality appears to be fair for age and reported activity level.      Assessment/Plan:  Avascular necrosis, right hip(s)  The patient history, physical examination, clinical judgement of the provider and imaging studies are consistent with avascular necrosis of the right hip(s) and total hip arthroplasty is deemed medically necessary. The treatment options including medical management, injection therapy, arthroscopy and arthroplasty were discussed at length. The risks and benefits of total hip arthroplasty were presented and reviewed. The risks due to aseptic loosening, infection, stiffness, dislocation/subluxation,  thromboembolic complications and other imponderables were discussed.  The patient acknowledged the explanation, agreed to proceed with the plan and consent was signed. Patient is being admitted for inpatient treatment for surgery, pain control, PT, OT, prophylactic antibiotics, VTE prophylaxis, progressive ambulation and ADL's and discharge planning.The patient is planning to be discharged home with  outpatient PT.    Patient's anticipated LOS is less than 2 midnights, meeting these requirements: - Younger than 33 - Lives within 1 hour of care - Has a competent adult at home to recover with post-op recover - NO history of  - Chronic pain requiring opiods  - Diabetes  - Coronary Artery Disease  - Heart failure  - Heart attack  - Stroke  - DVT/VTE  - Cardiac arrhythmia  - Respiratory Failure/COPD  - Renal failure  - Anemia  - Advanced Liver disease

## 2023-10-14 ENCOUNTER — Encounter (HOSPITAL_COMMUNITY): Payer: Self-pay

## 2023-10-14 ENCOUNTER — Encounter (HOSPITAL_COMMUNITY)
Admission: RE | Admit: 2023-10-14 | Discharge: 2023-10-14 | Disposition: A | Discharge status: Home / Self Care | Source: Ambulatory Visit | Attending: Orthopedic Surgery | Admitting: Orthopedic Surgery

## 2023-10-14 ENCOUNTER — Other Ambulatory Visit: Payer: Self-pay

## 2023-10-14 VITALS — BP 105/92 | HR 85 | Temp 98.2°F | Ht 66.5 in | Wt 177.0 lb

## 2023-10-14 DIAGNOSIS — M25551 Pain in right hip: Secondary | ICD-10-CM | POA: Insufficient documentation

## 2023-10-14 DIAGNOSIS — Z01812 Encounter for preprocedural laboratory examination: Secondary | ICD-10-CM | POA: Diagnosis present

## 2023-10-14 DIAGNOSIS — Z01818 Encounter for other preprocedural examination: Secondary | ICD-10-CM

## 2023-10-14 DIAGNOSIS — G8929 Other chronic pain: Secondary | ICD-10-CM | POA: Diagnosis not present

## 2023-10-14 LAB — COMPREHENSIVE METABOLIC PANEL WITH GFR
ALT: 20 U/L (ref 0–44)
AST: 15 U/L (ref 15–41)
Albumin: 4 g/dL (ref 3.5–5.0)
Alkaline Phosphatase: 66 U/L (ref 38–126)
Anion gap: 10 (ref 5–15)
BUN: 10 mg/dL (ref 6–20)
CO2: 28 mmol/L (ref 22–32)
Calcium: 9 mg/dL (ref 8.9–10.3)
Chloride: 101 mmol/L (ref 98–111)
Creatinine, Ser: 0.83 mg/dL (ref 0.61–1.24)
GFR, Estimated: >60 mL/min (ref >60)
Glucose, Bld: 103 mg/dL — ABNORMAL HIGH (ref 70–99)
Potassium: 3.8 mmol/L (ref 3.5–5.1)
Sodium: 139 mmol/L (ref 135–145)
Total Bilirubin: 0.6 mg/dL (ref 0.0–1.2)
Total Protein: 7.1 g/dL (ref 6.5–8.1)

## 2023-10-14 LAB — CBC WITH DIFFERENTIAL/PLATELET
Abs Immature Granulocytes: 0.02 10*3/uL (ref 0.00–0.07)
Basophils Absolute: 0 10*3/uL (ref 0.0–0.1)
Basophils Relative: 1 %
Eosinophils Absolute: 0.2 10*3/uL (ref 0.0–0.5)
Eosinophils Relative: 3 %
HCT: 39.5 % (ref 39.0–52.0)
Hemoglobin: 13 g/dL (ref 13.0–17.0)
Immature Granulocytes: 0 %
Lymphocytes Relative: 32 %
Lymphs Abs: 1.7 10*3/uL (ref 0.7–4.0)
MCH: 30.5 pg (ref 26.0–34.0)
MCHC: 32.9 g/dL (ref 30.0–36.0)
MCV: 92.7 fL (ref 80.0–100.0)
Monocytes Absolute: 0.7 10*3/uL (ref 0.1–1.0)
Monocytes Relative: 13 %
Neutro Abs: 2.8 10*3/uL (ref 1.7–7.7)
Neutrophils Relative %: 51 %
Platelets: 191 10*3/uL (ref 150–400)
RBC: 4.26 MIL/uL (ref 4.22–5.81)
RDW: 12.7 % (ref 11.5–15.5)
WBC: 5.3 10*3/uL (ref 4.0–10.5)
nRBC: 0 % (ref 0.0–0.2)

## 2023-10-14 LAB — TYPE AND SCREEN
ABO/RH(D): A NEG
Antibody Screen: NEGATIVE

## 2023-10-14 LAB — SURGICAL PCR SCREEN
MRSA, PCR: NEGATIVE
Staphylococcus aureus: NEGATIVE

## 2023-10-14 NOTE — Progress Notes (Signed)
 For Anesthesia: PCP - Practice, [facility]  Cardiologist - N/A  Bowel Prep reminder:  Chest x-ray -  EKG -  Stress Test -  ECHO -  Cardiac Cath -  Pacemaker/ICD device last checked: Pacemaker orders received: Device Rep notified:  Spinal Cord Stimulator:N/A  Sleep Study - N/A CPAP -   Fasting Blood Sugar -  Checks Blood Sugar _____ times a day Date and result of last Hgb A1c-  Last dose of GLP1 agonist- N/A GLP1 instructions:   Last dose of SGLT-2 inhibitors- N/A SGLT-2 instructions:   Blood Thinner Instructions: Aspirin  Instructions: Will be on hold after: 10/14/23 Last Dose:  Activity level: Can go up a flight of stairs and activities of daily living without stopping and without chest pain and/or shortness of breath   Able to exercise without chest pain and/or shortness of breath  Anesthesia review:   Patient denies shortness of breath, fever, cough and chest pain at PAT appointment   Patient verbalized understanding of instructions that were reviewed over the telephone.

## 2023-10-14 NOTE — Patient Instructions (Signed)
 SURGICAL WAITING ROOM VISITATION  Patients having surgery or a procedure may have no more than 2 support people in the waiting area - these visitors may rotate.    Children under the age of 79 must have an adult with them who is not the patient.  Visitors with respiratory illnesses are discouraged from visiting and should remain at home.  If the patient needs to stay at the hospital during part of their recovery, the visitor guidelines for inpatient rooms apply. Pre-op nurse will coordinate an appropriate time for 1 support person to accompany patient in pre-op.  This support person may not rotate.    Please refer to the [facility] website for the visitor guidelines for Inpatients (after your surgery is over and you are in a regular room).       Your procedure is scheduled on: 10/27/23   Report to [facility] Main Entrance    Report to admitting at : 6:00 AM   Call this number if you have problems the morning of surgery [phone]   Do not eat food :After Midnight.   After Midnight you may have the following liquids until : 5:30 AM DAY OF SURGERY  Water  Non-Citrus Juices (without pulp, NO RED-Apple, White grape, White cranberry) Black Coffee (NO MILK/CREAM OR CREAMERS, sugar ok)  Clear Tea (NO MILK/CREAM OR CREAMERS, sugar ok) regular and decaf                             Plain Jell-O (NO RED)                                           Fruit ices (not with fruit pulp, NO RED)                                     Popsicles (NO RED)                                                               Sports drinks like Gatorade (NO RED)                The day of surgery:  Drink ONE (1) Pre-Surgery Clear Ensure at : 5:30 AM the morning of surgery. Drink in one sitting. Do not sip.  This drink was given to you during your hospital  pre-op appointment visit. Nothing else to drink after completing the  Pre-Surgery Clear Ensure or G2.          If you have questions, please  contact your surgeon's office.  FOLLOW ANY ADDITIONAL PRE OP INSTRUCTIONS YOU RECEIVED FROM YOUR SURGEON'S OFFICE!!!   Oral Hygiene is also important to reduce your risk of infection.                                    Remember - BRUSH YOUR TEETH THE MORNING OF SURGERY WITH YOUR REGULAR TOOTHPASTE  DENTURES WILL BE REMOVED PRIOR TO SURGERY PLEASE DO NOT APPLY Poly grip  OR ADHESIVES!!!   Do NOT smoke after Midnight   Stop all vitamins and herbal supplements 7 days before surgery.   Take these medicines the morning of surgery with A SIP OF WATER : loratadine,omeprazole.Tylenol  as needed.                              You may not have any metal on your body including hair pins, jewelry, and body piercing             Do not wear lotions, powders, perfumes/cologne, or deodorant              Men may shave face and neck.   Do not bring valuables to the hospital. [facility] IS NOT             RESPONSIBLE   FOR VALUABLES.   Contacts, glasses, dentures or bridgework may not be worn into surgery.   Bring small overnight bag day of surgery.   DO NOT BRING YOUR HOME MEDICATIONS TO THE HOSPITAL. PHARMACY WILL DISPENSE MEDICATIONS LISTED ON YOUR MEDICATION LIST TO YOU DURING YOUR ADMISSION IN THE HOSPITAL!    Patients discharged on the day of surgery will not be allowed to drive home.  Someone NEEDS to stay with you for the first 24 hours after anesthesia.   Special Instructions: Bring a copy of your healthcare power of attorney and living will documents the day of surgery if you haven't scanned them before.              Please read over the following fact sheets you were given: IF YOU HAVE QUESTIONS ABOUT YOUR PRE-OP INSTRUCTIONS PLEASE CALL [phone]   If you received a COVID test during your pre-op visit  it is requested that you wear a mask when out in public, stay away from anyone that may not be feeling well and notify your surgeon if you develop symptoms. If you test positive for  Covid or have been in contact with anyone that has tested positive in the last 10 days please notify you surgeon.      Pre-operative 5 CHG Bath Instructions   You can play a key role in reducing the risk of infection after surgery. Your skin needs to be as free of germs as possible. You can reduce the number of germs on your skin by washing with CHG (chlorhexidine  gluconate) soap before surgery. CHG is an antiseptic soap that kills germs and continues to kill germs even after washing.   DO NOT use if you have an allergy to chlorhexidine /CHG or antibacterial soaps. If your skin becomes reddened or irritated, stop using the CHG and notify one of our RNs at [phone].   Please shower with the CHG soap starting 4 days before surgery using the following schedule:     Please keep in mind the following:  DO NOT shave, including legs and underarms, starting the day of your first shower.   You may shave your face at any point before/day of surgery.  Place clean sheets on your bed the day you start using CHG soap. Use a clean washcloth (not used since being washed) for each shower. DO NOT sleep with pets once you start using the CHG.   CHG Shower Instructions:  If you choose to wash your hair and private area, wash first with your normal shampoo/soap.  After you use shampoo/soap, rinse your hair and body thoroughly to remove shampoo/soap residue.  Turn the water  OFF and apply about 3 tablespoons (45 ml) of CHG soap to a CLEAN washcloth.  Apply CHG soap ONLY FROM YOUR NECK DOWN TO YOUR TOES (washing for 3-5 minutes)  DO NOT use CHG soap on face, private areas, open wounds, or sores.  Pay special attention to the area where your surgery is being performed.  If you are having back surgery, having someone wash your back for you may be helpful. Wait 2 minutes after CHG soap is applied, then you may rinse off the CHG soap.  Pat dry with a clean towel  Put on clean clothes/pajamas   If you choose  to wear lotion, please use ONLY the CHG-compatible lotions on the back of this paper.     Additional instructions for the day of surgery: DO NOT APPLY any lotions, deodorants, cologne, or perfumes.   Put on clean/comfortable clothes.  Brush your teeth.  Ask your nurse before applying any prescription medications to the skin.   CHG Compatible Lotions   Aveeno Moisturizing lotion  Cetaphil Moisturizing Cream  Cetaphil Moisturizing Lotion  Clairol Herbal Essence Moisturizing Lotion, Dry Skin  Clairol Herbal Essence Moisturizing Lotion, Extra Dry Skin  Clairol Herbal Essence Moisturizing Lotion, Normal Skin  Curel Age Defying Therapeutic Moisturizing Lotion with Alpha Hydroxy  Curel Extreme Care Body Lotion  Curel Soothing Hands Moisturizing Hand Lotion  Curel Therapeutic Moisturizing Cream, Fragrance-Free  Curel Therapeutic Moisturizing Lotion, Fragrance-Free  Curel Therapeutic Moisturizing Lotion, Original Formula  Eucerin Daily Replenishing Lotion  Eucerin Dry Skin Therapy Plus Alpha Hydroxy Crme  Eucerin Dry Skin Therapy Plus Alpha Hydroxy Lotion  Eucerin Original Crme  Eucerin Original Lotion  Eucerin Plus Crme Eucerin Plus Lotion  Eucerin TriLipid Replenishing Lotion  Keri Anti-Bacterial Hand Lotion  Keri Deep Conditioning Original Lotion Dry Skin Formula Softly Scented  Keri Deep Conditioning Original Lotion, Fragrance Free Sensitive Skin Formula  Keri Lotion Fast Absorbing Fragrance Free Sensitive Skin Formula  Keri Lotion Fast Absorbing Softly Scented Dry Skin Formula  Keri Original Lotion  Keri Skin Renewal Lotion Keri Silky Smooth Lotion  Keri Silky Smooth Sensitive Skin Lotion  Nivea Body Creamy Conditioning Oil  Nivea Body Extra Enriched Lotion  Nivea Body Original Lotion  Nivea Body Sheer Moisturizing Lotion Nivea Crme  Nivea Skin Firming Lotion  NutraDerm 30 Skin Lotion  NutraDerm Skin Lotion  NutraDerm Therapeutic Skin Cream  NutraDerm Therapeutic Skin  Lotion  ProShield Protective Hand Cream  Provon moisturizing lotion   Incentive Spirometer  An incentive spirometer is a tool that can help keep your lungs clear and active. This tool measures how well you are filling your lungs with each breath. Taking long deep breaths may help reverse or decrease the chance of developing breathing (pulmonary) problems (especially infection) following: A long period of time when you are unable to move or be active. BEFORE THE PROCEDURE  If the spirometer includes an indicator to show your best effort, your nurse or respiratory therapist will set it to a desired goal. If possible, sit up straight or lean slightly forward. Try not to slouch. Hold the incentive spirometer in an upright position. INSTRUCTIONS FOR USE  Sit on the edge of your bed if possible, or sit up as far as you can in bed or on a chair. Hold the incentive spirometer in an upright position. Breathe out normally. Place the mouthpiece in your mouth and seal your lips tightly around it. Breathe in slowly and as deeply as possible, raising the piston or  the ball toward the top of the column. Hold your breath for 3-5 seconds or for as long as possible. Allow the piston or ball to fall to the bottom of the column. Remove the mouthpiece from your mouth and breathe out normally. Rest for a few seconds and repeat Steps 1 through 7 at least 10 times every 1-2 hours when you are awake. Take your time and take a few normal breaths between deep breaths. The spirometer may include an indicator to show your best effort. Use the indicator as a goal to work toward during each repetition. After each set of 10 deep breaths, practice coughing to be sure your lungs are clear. If you have an incision (the cut made at the time of surgery), support your incision when coughing by placing a pillow or rolled up towels firmly against it. Once you are able to get out of bed, walk around indoors and cough well. You may  stop using the incentive spirometer when instructed by your caregiver.  RISKS AND COMPLICATIONS Take your time so you do not get dizzy or light-headed. If you are in pain, you may need to take or ask for pain medication before doing incentive spirometry. It is harder to take a deep breath if you are having pain. AFTER USE Rest and breathe slowly and easily. It can be helpful to keep track of a log of your progress. Your caregiver can provide you with a simple table to help with this. If you are using the spirometer at home, follow these instructions: SEEK MEDICAL CARE IF:  You are having difficultly using the spirometer. You have trouble using the spirometer as often as instructed. Your pain medication is not giving enough relief while using the spirometer. You develop fever of 100.5 F (38.1 C) or higher. SEEK IMMEDIATE MEDICAL CARE IF:  You cough up bloody sputum that had not been present before. You develop fever of 102 F (38.9 C) or greater. You develop worsening pain at or near the incision site. MAKE SURE YOU:  Understand these instructions. Will watch your condition. Will get help right away if you are not doing well or get worse. Document Released: 08/17/2006 Document Revised: 06/29/2011 Document Reviewed: 10/18/2006 [facility] Patient Information 2014 [place], [place].   ________________________________________________________________________

## 2023-10-26 NOTE — Anesthesia Preprocedure Evaluation (Signed)
 Anesthesia Evaluation  Patient identified by MRN, date of birth, ID band Patient awake    Reviewed: Allergy & Precautions, NPO status , Patient's Chart, lab work & pertinent test results  Airway Mallampati: II  TM Distance: >3 FB Neck ROM: Full    Dental  (+) Missing   Pulmonary former smoker   Pulmonary exam normal        Cardiovascular negative cardio ROS Normal cardiovascular exam     Neuro/Psych negative neurological ROS  negative psych ROS   GI/Hepatic Neg liver ROS,GERD  Medicated and Controlled,,  Endo/Other  negative endocrine ROS    Renal/GU negative Renal ROS     Musculoskeletal negative musculoskeletal ROS (+)    Abdominal   Peds  Hematology negative hematology ROS (+) PLT: 191k   Anesthesia Other Findings OA RIGHT HIP  Reproductive/Obstetrics                              Anesthesia Physical Anesthesia Plan  ASA: 2  Anesthesia Plan: Spinal   Post-op Pain Management:    Induction: Intravenous  PONV Risk Score and Plan: 1 and Ondansetron , Dexamethasone , Propofol  infusion, Midazolam  and Treatment may vary due to age or medical condition  Airway Management Planned: Simple Face Mask  Additional Equipment:   Intra-op Plan:   Post-operative Plan:   Informed Consent: I have reviewed the patients History and Physical, chart, labs and discussed the procedure including the risks, benefits and alternatives for the proposed anesthesia with the patient or authorized representative who has indicated his/her understanding and acceptance.     Dental advisory given  Plan Discussed with: CRNA  Anesthesia Plan Comments:         Anesthesia Quick Evaluation

## 2023-10-27 ENCOUNTER — Ambulatory Visit (HOSPITAL_COMMUNITY)

## 2023-10-27 ENCOUNTER — Encounter (HOSPITAL_COMMUNITY)
Admission: RE | Disposition: A | Discharge status: Home / Self Care | Payer: Self-pay | Source: Ambulatory Visit | Attending: Orthopedic Surgery

## 2023-10-27 ENCOUNTER — Encounter (HOSPITAL_COMMUNITY): Payer: Self-pay | Admitting: Orthopedic Surgery

## 2023-10-27 ENCOUNTER — Ambulatory Visit (HOSPITAL_BASED_OUTPATIENT_CLINIC_OR_DEPARTMENT_OTHER)

## 2023-10-27 ENCOUNTER — Encounter (HOSPITAL_COMMUNITY)

## 2023-10-27 ENCOUNTER — Ambulatory Visit (HOSPITAL_COMMUNITY)
Admission: RE | Admit: 2023-10-27 | Discharge: 2023-10-27 | Disposition: A | Discharge status: Home / Self Care | Source: Ambulatory Visit | Attending: Orthopedic Surgery | Admitting: Orthopedic Surgery

## 2023-10-27 ENCOUNTER — Other Ambulatory Visit: Payer: Self-pay

## 2023-10-27 DIAGNOSIS — M879 Osteonecrosis, unspecified: Secondary | ICD-10-CM | POA: Insufficient documentation

## 2023-10-27 DIAGNOSIS — Z87891 Personal history of nicotine dependence: Secondary | ICD-10-CM | POA: Diagnosis not present

## 2023-10-27 DIAGNOSIS — Z79899 Other long term (current) drug therapy: Secondary | ICD-10-CM | POA: Diagnosis not present

## 2023-10-27 DIAGNOSIS — K219 Gastro-esophageal reflux disease without esophagitis: Secondary | ICD-10-CM | POA: Diagnosis not present

## 2023-10-27 DIAGNOSIS — M87051 Idiopathic aseptic necrosis of right femur: Secondary | ICD-10-CM | POA: Diagnosis not present

## 2023-10-27 HISTORY — PX: TOTAL HIP ARTHROPLASTY: SHX124

## 2023-10-27 SURGERY — ARTHROPLASTY, HIP, TOTAL,POSTERIOR APPROACH
Anesthesia: Spinal | Site: Hip | Laterality: Right

## 2023-10-27 MED ORDER — LACTATED RINGERS IV BOLUS: 500.0000 mL | Freq: Once | INTRAVENOUS | Status: DC

## 2023-10-27 MED ORDER — FENTANYL CITRATE PF 50 MCG/ML IJ SOSY
PREFILLED_SYRINGE | INTRAMUSCULAR | Status: AC
  Filled 2023-10-27: qty 1

## 2023-10-27 MED ORDER — 0.9 % SODIUM CHLORIDE (POUR BTL) OPTIME
TOPICAL | Status: DC | PRN
Start: 2023-10-27 — End: 2023-10-27
  Administered 2023-10-27: 1000 mL

## 2023-10-27 MED ORDER — FENTANYL CITRATE (PF) 100 MCG/2ML IJ SOLN
INTRAMUSCULAR | Status: AC
Start: 2023-10-27 — End: 2023-10-27
  Filled 2023-10-27: qty 2

## 2023-10-27 MED ORDER — LIDOCAINE HCL (PF) 2 % IJ SOLN
INTRAMUSCULAR | Status: AC
  Filled 2023-10-27: qty 5

## 2023-10-27 MED ORDER — OXYCODONE HCL 5 MG PO TABS
5.0000 mg | ORAL_TABLET | ORAL | Status: DC | PRN
  Administered 2023-10-27: 5 mg via ORAL

## 2023-10-27 MED ORDER — WATER FOR IRRIGATION, STERILE IR SOLN
Status: DC | PRN
  Administered 2023-10-27: 2000 mL

## 2023-10-27 MED ORDER — ONDANSETRON HCL 4 MG/2ML IJ SOLN
INTRAMUSCULAR | Status: DC | PRN
Start: 2023-10-27 — End: 2023-10-27
  Administered 2023-10-27: 4 mg via INTRAVENOUS

## 2023-10-27 MED ORDER — PROPOFOL 10 MG/ML IV BOLUS
INTRAVENOUS | Status: DC | PRN
Start: 2023-10-27 — End: 2023-10-27
  Administered 2023-10-27 (×2): 50 mg via INTRAVENOUS
  Administered 2023-10-27: 30 mg via INTRAVENOUS
  Administered 2023-10-27: 20 mg via INTRAVENOUS
  Administered 2023-10-27: 50 mg via INTRAVENOUS

## 2023-10-27 MED ORDER — OXYCODONE HCL 5 MG PO TABS
ORAL_TABLET | ORAL | Status: AC
  Filled 2023-10-27: qty 1

## 2023-10-27 MED ORDER — METHOCARBAMOL 500 MG PO TABS
ORAL_TABLET | ORAL | Status: AC
  Filled 2023-10-27: qty 1

## 2023-10-27 MED ORDER — GLYCOPYRROLATE 0.2 MG/ML IJ SOLN
INTRAMUSCULAR | Status: AC
  Filled 2023-10-27: qty 1

## 2023-10-27 MED ORDER — SODIUM CHLORIDE 0.9 % IV SOLN: INTRAVENOUS | Status: DC

## 2023-10-27 MED ORDER — BUPIVACAINE LIPOSOME 1.3 % IJ SUSP: 10.0000 mL | Freq: Once | INTRAMUSCULAR | Status: DC

## 2023-10-27 MED ORDER — OXYCODONE HCL 5 MG PO TABS: 5.0000 mg | ORAL_TABLET | Freq: Once | ORAL | Status: DC | PRN

## 2023-10-27 MED ORDER — ISOPROPYL ALCOHOL 70 % SOLN
Status: DC | PRN
  Administered 2023-10-27: 1 via TOPICAL

## 2023-10-27 MED ORDER — LACTATED RINGERS IV BOLUS
250.0000 mL | Freq: Once | INTRAVENOUS | Status: AC
  Administered 2023-10-27: 250 mL via INTRAVENOUS

## 2023-10-27 MED ORDER — PHENYLEPHRINE HCL (PRESSORS) 10 MG/ML IV SOLN
INTRAVENOUS | Status: AC
  Filled 2023-10-27: qty 1

## 2023-10-27 MED ORDER — PHENYLEPHRINE HCL-NACL 20-0.9 MG/250ML-% IV SOLN
INTRAVENOUS | Status: DC | PRN
  Administered 2023-10-27: 25 ug/min via INTRAVENOUS

## 2023-10-27 MED ORDER — PROPOFOL 500 MG/50ML IV EMUL
INTRAVENOUS | Status: AC
Start: 2023-10-27 — End: 2023-10-27
  Filled 2023-10-27: qty 50

## 2023-10-27 MED ORDER — ISOPROPYL ALCOHOL 70 % SOLN
Status: AC
  Filled 2023-10-27: qty 480

## 2023-10-27 MED ORDER — METHOCARBAMOL 500 MG PO TABS: 500.0000 mg | ORAL_TABLET | Freq: Three times a day (TID) | ORAL | 0 refills | Status: AC | PRN

## 2023-10-27 MED ORDER — ONDANSETRON HCL 4 MG PO TABS: 4.0000 mg | ORAL_TABLET | Freq: Four times a day (QID) | ORAL | Status: DC | PRN

## 2023-10-27 MED ORDER — LACTATED RINGERS IV SOLN: INTRAVENOUS | Status: DC

## 2023-10-27 MED ORDER — SODIUM CHLORIDE (PF) 0.9 % IJ SOLN
INTRAMUSCULAR | Status: DC | PRN
  Administered 2023-10-27: 80 mL

## 2023-10-27 MED ORDER — ASPIRIN 81 MG PO TBEC: 81.0000 mg | DELAYED_RELEASE_TABLET | Freq: Two times a day (BID) | ORAL | Status: AC

## 2023-10-27 MED ORDER — AMISULPRIDE (ANTIEMETIC) 5 MG/2ML IV SOLN: 10.0000 mg | Freq: Once | INTRAVENOUS | Status: DC | PRN

## 2023-10-27 MED ORDER — HYDROMORPHONE HCL 1 MG/ML IJ SOLN
INTRAMUSCULAR | Status: AC
  Filled 2023-10-27: qty 1

## 2023-10-27 MED ORDER — CEFAZOLIN SODIUM-DEXTROSE 2-4 GM/100ML-% IV SOLN: 2.0000 g | Freq: Four times a day (QID) | INTRAVENOUS | Status: DC

## 2023-10-27 MED ORDER — ACETAMINOPHEN 500 MG PO TABS
1000.0000 mg | ORAL_TABLET | Freq: Once | ORAL | Status: AC
  Administered 2023-10-27: 1000 mg via ORAL
  Filled 2023-10-27: qty 2

## 2023-10-27 MED ORDER — ONDANSETRON HCL 4 MG/2ML IJ SOLN: 4.0000 mg | Freq: Four times a day (QID) | INTRAMUSCULAR | Status: DC | PRN

## 2023-10-27 MED ORDER — SODIUM CHLORIDE (PF) 0.9 % IJ SOLN
INTRAMUSCULAR | Status: AC
  Filled 2023-10-27: qty 30

## 2023-10-27 MED ORDER — KETOROLAC TROMETHAMINE 15 MG/ML IJ SOLN
15.0000 mg | Freq: Four times a day (QID) | INTRAMUSCULAR | Status: DC
  Administered 2023-10-27: 15 mg via INTRAVENOUS

## 2023-10-27 MED ORDER — ACETAMINOPHEN 500 MG PO TABS: 1000.0000 mg | ORAL_TABLET | Freq: Three times a day (TID) | ORAL | Status: AC | PRN

## 2023-10-27 MED ORDER — FENTANYL CITRATE (PF) 100 MCG/2ML IJ SOLN
INTRAMUSCULAR | Status: DC | PRN
  Administered 2023-10-27 (×2): 50 ug via INTRAVENOUS

## 2023-10-27 MED ORDER — CEFAZOLIN SODIUM-DEXTROSE 2-4 GM/100ML-% IV SOLN
2.0000 g | INTRAVENOUS | Status: AC
  Administered 2023-10-27: 2 g via INTRAVENOUS
  Filled 2023-10-27: qty 100

## 2023-10-27 MED ORDER — DEXAMETHASONE SODIUM PHOSPHATE 10 MG/ML IJ SOLN
8.0000 mg | Freq: Once | INTRAMUSCULAR | Status: AC
Start: 2023-10-27 — End: 2023-10-27
  Administered 2023-10-27: 8 mg via INTRAVENOUS

## 2023-10-27 MED ORDER — PROPOFOL 10 MG/ML IV BOLUS
INTRAVENOUS | Status: AC
  Filled 2023-10-27: qty 20

## 2023-10-27 MED ORDER — POVIDONE-IODINE 10 % EX SWAB: 2.0000 | Freq: Once | CUTANEOUS | Status: DC

## 2023-10-27 MED ORDER — BUPIVACAINE IN DEXTROSE 0.75-8.25 % IT SOLN
INTRATHECAL | Status: DC | PRN
  Administered 2023-10-27: 2 mL via INTRATHECAL

## 2023-10-27 MED ORDER — MELOXICAM 15 MG PO TABS: 15.0000 mg | ORAL_TABLET | Freq: Every day | ORAL | 0 refills | Status: AC

## 2023-10-27 MED ORDER — SODIUM CHLORIDE 0.9 % IR SOLN
Status: DC | PRN
  Administered 2023-10-27: 3000 mL

## 2023-10-27 MED ORDER — MIDAZOLAM HCL 2 MG/2ML IJ SOLN
INTRAMUSCULAR | Status: AC
  Filled 2023-10-27: qty 2

## 2023-10-27 MED ORDER — METHOCARBAMOL 1000 MG/10ML IJ SOLN: 500.0000 mg | Freq: Four times a day (QID) | INTRAMUSCULAR | Status: DC | PRN

## 2023-10-27 MED ORDER — POVIDONE-IODINE 10 % EX SWAB: 1.0000 | Freq: Two times a day (BID) | CUTANEOUS | Status: DC | PRN

## 2023-10-27 MED ORDER — PROPOFOL 1000 MG/100ML IV EMUL
INTRAVENOUS | Status: AC
  Filled 2023-10-27: qty 100

## 2023-10-27 MED ORDER — EPHEDRINE 5 MG/ML INJ
INTRAVENOUS | Status: AC
  Filled 2023-10-27: qty 5

## 2023-10-27 MED ORDER — OXYCODONE HCL 5 MG/5ML PO SOLN: 5.0000 mg | Freq: Once | ORAL | Status: DC | PRN

## 2023-10-27 MED ORDER — ONDANSETRON HCL 4 MG/2ML IJ SOLN
INTRAMUSCULAR | Status: AC
  Filled 2023-10-27: qty 2

## 2023-10-27 MED ORDER — KETOROLAC TROMETHAMINE 15 MG/ML IJ SOLN
INTRAMUSCULAR | Status: AC
  Filled 2023-10-27: qty 1

## 2023-10-27 MED ORDER — ACETAMINOPHEN 500 MG PO TABS: 1000.0000 mg | ORAL_TABLET | Freq: Four times a day (QID) | ORAL | Status: DC

## 2023-10-27 MED ORDER — EPHEDRINE SULFATE-NACL 50-0.9 MG/10ML-% IV SOSY
PREFILLED_SYRINGE | INTRAVENOUS | Status: DC | PRN
  Administered 2023-10-27: 5 mg via INTRAVENOUS

## 2023-10-27 MED ORDER — ORAL CARE MOUTH RINSE: 15.0000 mL | Freq: Once | OROMUCOSAL | Status: AC

## 2023-10-27 MED ORDER — BUPIVACAINE-EPINEPHRINE (PF) 0.25% -1:200000 IJ SOLN
INTRAMUSCULAR | Status: AC
  Filled 2023-10-27: qty 30

## 2023-10-27 MED ORDER — HYDROMORPHONE HCL 1 MG/ML IJ SOLN
0.5000 mg | INTRAMUSCULAR | Status: DC | PRN
  Administered 2023-10-27: 1 mg via INTRAVENOUS

## 2023-10-27 MED ORDER — DEXAMETHASONE SODIUM PHOSPHATE 10 MG/ML IJ SOLN
INTRAMUSCULAR | Status: AC
  Filled 2023-10-27: qty 1

## 2023-10-27 MED ORDER — CHLORHEXIDINE GLUCONATE 0.12 % MT SOLN
15.0000 mL | Freq: Once | OROMUCOSAL | Status: AC
Start: 2023-10-27 — End: 2023-10-27
  Administered 2023-10-27: 15 mL via OROMUCOSAL

## 2023-10-27 MED ORDER — TRANEXAMIC ACID-NACL 1000-0.7 MG/100ML-% IV SOLN
1000.0000 mg | INTRAVENOUS | Status: AC
  Administered 2023-10-27: 1000 mg via INTRAVENOUS
  Filled 2023-10-27: qty 100

## 2023-10-27 MED ORDER — BUPIVACAINE LIPOSOME 1.3 % IJ SUSP
INTRAMUSCULAR | Status: AC
  Filled 2023-10-27: qty 20

## 2023-10-27 MED ORDER — FENTANYL CITRATE PF 50 MCG/ML IJ SOSY
25.0000 ug | PREFILLED_SYRINGE | INTRAMUSCULAR | Status: DC | PRN
  Administered 2023-10-27: 25 ug via INTRAVENOUS

## 2023-10-27 MED ORDER — PROPOFOL 500 MG/50ML IV EMUL
INTRAVENOUS | Status: DC | PRN
  Administered 2023-10-27 (×2): 50 mg via INTRAVENOUS
  Administered 2023-10-27: 80 ug/kg/min via INTRAVENOUS
  Administered 2023-10-27: 50 mg via INTRAVENOUS

## 2023-10-27 MED ORDER — GLYCOPYRROLATE 0.2 MG/ML IJ SOLN
INTRAMUSCULAR | Status: DC | PRN
  Administered 2023-10-27: .2 mg via INTRAVENOUS

## 2023-10-27 MED ORDER — MIDAZOLAM HCL 2 MG/2ML IJ SOLN
INTRAMUSCULAR | Status: DC | PRN
  Administered 2023-10-27: 2 mg via INTRAVENOUS

## 2023-10-27 MED ORDER — METHOCARBAMOL 500 MG PO TABS
500.0000 mg | ORAL_TABLET | Freq: Four times a day (QID) | ORAL | Status: DC | PRN
  Administered 2023-10-27: 500 mg via ORAL

## 2023-10-27 SURGICAL SUPPLY — 67 items
BAG COUNTER SPONGE SURGICOUNT (BAG) IMPLANT
BAG ZIPLOCK 12X15 (MISCELLANEOUS) ×1 IMPLANT
BIT DRILL TRIDENT 4X25 SU (BIT) IMPLANT
BLADE SAW SAG 25X90X1.19 (BLADE) ×1 IMPLANT
BRUSH FEMORAL CANAL (MISCELLANEOUS) IMPLANT
CHLORAPREP W/TINT 26 (MISCELLANEOUS) ×2 IMPLANT
CNTNR URN SCR LID CUP LEK RST (MISCELLANEOUS) ×1 IMPLANT
COVER SURGICAL LIGHT HANDLE (MISCELLANEOUS) ×1 IMPLANT
DERMABOND ADVANCED .7 DNX12 (GAUZE/BANDAGES/DRESSINGS) ×2 IMPLANT
DRAPE HIP W/POCKET STRL (MISCELLANEOUS) ×1 IMPLANT
DRAPE INCISE 23X17 STRL (DRAPES) IMPLANT
DRAPE INCISE IOBAN 85X60 (DRAPES) ×2 IMPLANT
DRAPE POUCH INSTRU U-SHP 10X18 (DRAPES) ×1 IMPLANT
DRAPE SHEET LG 3/4 BI-LAMINATE (DRAPES) ×3 IMPLANT
DRAPE U-SHAPE 47X51 STRL (DRAPES) ×2 IMPLANT
DRSG AQUACEL AG ADV 3.5X10 (GAUZE/BANDAGES/DRESSINGS) ×1 IMPLANT
ELECT BLADE TIP CTD 4 INCH (ELECTRODE) ×1 IMPLANT
ELECT REM PT RETURN 15FT ADLT (MISCELLANEOUS) ×1 IMPLANT
FACESHIELD WRAPAROUND (MASK) ×1 IMPLANT
FACESHIELD WRAPAROUND OR TEAM (MASK) ×1 IMPLANT
GAUZE SPONGE 4X4 12PLY STRL (GAUZE/BANDAGES/DRESSINGS) ×1 IMPLANT
GLOVE BIO SURGEON STRL SZ 6.5 (GLOVE) ×2 IMPLANT
GLOVE BIOGEL PI IND STRL 6.5 (GLOVE) ×1 IMPLANT
GLOVE BIOGEL PI IND STRL 8 (GLOVE) ×1 IMPLANT
GLOVE SURG ORTHO 8.0 STRL STRW (GLOVE) ×2 IMPLANT
GOWN STRL REUS W/ TWL XL LVL3 (GOWN DISPOSABLE) ×2 IMPLANT
HEAD CERAMIC FEMORAL 36MM (Head) IMPLANT
HOLDER FOLEY CATH W/STRAP (MISCELLANEOUS) ×1 IMPLANT
HOOD PEEL AWAY T7 (MISCELLANEOUS) ×3 IMPLANT
INSERT TRIDENT POLY 36 0DEG (Insert) IMPLANT
KIT BASIN OR (CUSTOM PROCEDURE TRAY) ×1 IMPLANT
KIT TURNOVER KIT A (KITS) ×1 IMPLANT
MANIFOLD NEPTUNE II (INSTRUMENTS) ×1 IMPLANT
MARKER SKIN DUAL TIP RULER LAB (MISCELLANEOUS) ×1 IMPLANT
NDL SAFETY ECLIPSE 18X1.5 (NEEDLE) ×2 IMPLANT
NS IRRIG 1000ML POUR BTL (IV SOLUTION) ×1 IMPLANT
PACK TOTAL JOINT (CUSTOM PROCEDURE TRAY) ×1 IMPLANT
PAD ARMBOARD POSITIONER FOAM (MISCELLANEOUS) ×1 IMPLANT
PENCIL SMOKE EVACUATOR (MISCELLANEOUS) ×1 IMPLANT
PRESSURIZER FEMORAL UNIV (MISCELLANEOUS) IMPLANT
PROTECTOR NERVE ULNAR (MISCELLANEOUS) IMPLANT
RETRIEVER SUT HEWSON (MISCELLANEOUS) ×1 IMPLANT
SCREW HEX LP 6.5X20 (Screw) IMPLANT
SCREW HEX LP 6.5X25 (Screw) IMPLANT
SEALER BIPOLAR AQUA 6.0 (INSTRUMENTS) IMPLANT
SET HNDPC FAN SPRY TIP SCT (DISPOSABLE) IMPLANT
SHELL CLUSTERHOLE ACETABULAR 5 (Shell) IMPLANT
SHIELD FACE FULL FLUID (MISCELLANEOUS) ×1 IMPLANT
SOLUTION IRRIG SURGIPHOR (IV SOLUTION) IMPLANT
SOLUTION PRONTOSAN WOUND 350ML (IRRIGATION / IRRIGATOR) IMPLANT
SPIKE FLUID TRANSFER (MISCELLANEOUS) ×1 IMPLANT
STEM HIP 127 DEG (Stem) IMPLANT
SUCTION TUBE FRAZIER 12FR DISP (SUCTIONS) ×1 IMPLANT
SUT BONE WAX W31G (SUTURE) ×1 IMPLANT
SUT ETHIBOND #5 BRAIDED 30INL (SUTURE) ×1 IMPLANT
SUT MNCRL AB 3-0 PS2 18 (SUTURE) ×1 IMPLANT
SUT STRATAFIX 14 PDO 48 VLT (SUTURE) ×1 IMPLANT
SUT VIC AB 2-0 CT2 27 (SUTURE) ×2 IMPLANT
SUTURE STRATFX 0 PDS 27 VIOLET (SUTURE) ×1 IMPLANT
SYR 30ML LL (SYRINGE) ×1 IMPLANT
SYR 50ML LL SCALE MARK (SYRINGE) ×1 IMPLANT
TOWEL OR 17X26 10 PK STRL BLUE (TOWEL DISPOSABLE) ×1 IMPLANT
TOWER CARTRIDGE SMART MIX (DISPOSABLE) IMPLANT
TRAY FOLEY MTR SLVR 16FR STAT (SET/KITS/TRAYS/PACK) IMPLANT
TUBE SUCTION HIGH CAP CLEAR NV (SUCTIONS) ×1 IMPLANT
UNDERPAD 30X36 HEAVY ABSORB (UNDERPADS AND DIAPERS) ×1 IMPLANT
WATER STERILE IRR 1000ML POUR (IV SOLUTION) ×2 IMPLANT

## 2023-10-27 NOTE — Anesthesia Procedure Notes (Addendum)
 Spinal  Patient location during procedure: OR Start time: 10/27/2023 8:45 AM End time: 10/27/2023 8:50 AM Reason for block: surgical anesthesia Staffing Performed: anesthesiologist  Anesthesiologist: Patrisha Bernardino SQUIBB, MD Performed by: Patrisha Bernardino SQUIBB, MD Authorized by: Patrisha Bernardino SQUIBB, MD   Preanesthetic Checklist Completed: patient identified, IV checked, risks and benefits discussed, surgical consent, monitors and equipment checked, pre-op evaluation and timeout performed Spinal Block Patient position: sitting Prep: ChloraPrep and DuraPrep Patient monitoring: cardiac monitor, continuous pulse ox and blood pressure Approach: midline Location: L3-4 Injection technique: single-shot Needle Needle type: Pencan  Needle gauge: 24 G Needle length: 9 cm Assessment Sensory level: T10 Events: CSF return Additional Notes Functioning IV was confirmed and monitors were applied. Sterile prep and drape, including hand hygiene and sterile gloves were used. The patient was positioned and the spine was prepped. The skin was anesthetized with lidocaine .  Free flow of clear CSF was obtained prior to injecting local anesthetic into the CSF.  The spinal needle aspirated freely following injection.  The needle was carefully withdrawn.  The patient tolerated the procedure well.

## 2023-10-27 NOTE — Discharge Instructions (Signed)

## 2023-10-27 NOTE — Anesthesia Postprocedure Evaluation (Signed)
 Anesthesia Post Note  Patient: Douglas Turner  Procedure(s) Performed: ARTHROPLASTY, HIP, TOTAL,POSTERIOR APPROACH (Right: Hip)     Patient location during evaluation: PACU Anesthesia Type: Spinal Level of consciousness: awake Pain management: pain level controlled Vital Signs Assessment: post-procedure vital signs reviewed and stable Respiratory status: spontaneous breathing, nonlabored ventilation and respiratory function stable Cardiovascular status: blood pressure returned to baseline and stable Postop Assessment: no apparent nausea or vomiting Anesthetic complications: no   No notable events documented.  Last Vitals:  Vitals:   10/27/23 1400 10/27/23 1519  BP: 131/87 120/77  Pulse:  86  Resp:    Temp:  36.5 C  SpO2:  99%    Last Pain:  Vitals:   10/27/23 1545  TempSrc:   PainSc: 5                  Harold Mattes P Cochise Dinneen

## 2023-10-27 NOTE — Transfer of Care (Signed)
 Immediate Anesthesia Transfer of Care Note  Patient: Douglas Turner  Procedure(s) Performed: ARTHROPLASTY, HIP, TOTAL,POSTERIOR APPROACH (Right: Hip)  Patient Location: PACU  Anesthesia Type:Spinal  Level of Consciousness: awake, alert , and oriented  Airway & Oxygen Therapy: Patient Spontanous Breathing and Patient connected to nasal cannula oxygen  Post-op Assessment: Report given to RN and Post -op Vital signs reviewed and stable  Post vital signs: Reviewed and stable  Last Vitals:  Vitals Value Taken Time  BP 101/60 10/27/23 10:53  Temp    Pulse 72 10/27/23 10:57  Resp 20 10/27/23 10:57  SpO2 100 % 10/27/23 10:57  Vitals shown include unfiled device data.  Last Pain:  Vitals:   10/27/23 0644  TempSrc: Oral         Complications: No notable events documented.

## 2023-10-27 NOTE — Interval H&P Note (Signed)
 The patient has been re-examined, and the chart reviewed, and there have been no interval changes to the documented history and physical.    Plan for R THA for Right hip AVN  The operative side was examined and the patient was confirmed to have sensation to DPN, SPN, TN intact, Motor EHL, ext, flex 5/5, and DP 2+, PT 2+, No significant edema.   The risks, benefits, and alternatives have been discussed at length with patient, and the patient is willing to proceed.  Right hip marked. Consent has been signed.

## 2023-10-27 NOTE — Anesthesia Procedure Notes (Addendum)
 Procedure Name: MAC Date/Time: 10/27/2023 8:28 AM  Performed by: Landy Chip HERO, CRNAPre-anesthesia Checklist: Patient identified, Emergency Drugs available, Suction available, Patient being monitored and Timeout performed Patient Re-evaluated:Patient Re-evaluated prior to induction Oxygen Delivery Method: Nasal cannula Preoxygenation: Pre-oxygenation with 100% oxygen Induction Type: IV induction Placement Confirmation: positive ETCO2 Dental Injury: Teeth and Oropharynx as per pre-operative assessment

## 2023-10-27 NOTE — Anesthesia Procedure Notes (Signed)
 Date/Time: 10/27/2023 8:55 AM  Performed by: Landy Chip HERO, CRNAOxygen Delivery Method: Simple face mask Placement Confirmation: positive ETCO2 Dental Injury: Teeth and Oropharynx as per pre-operative assessment

## 2023-10-27 NOTE — Op Note (Signed)
 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  The risks benefits and alternatives were discussed with the patient including but not limited to the risks of nonoperative treatment, versus surgical intervention including infection, bleeding, nerve injury, periprosthetic fracture, the need for revision surgery, dislocation, leg length discrepancy, blood clots, cardiopulmonary complications, morbidity, mortality, among others, and they were willing to proceed.     OPERATIVE REPORT     SURGEON:  Toribio Higashi, MD    ASSISTANT: Bernarda Mclean, PA-C, (Present throughout the entire procedure,  necessary for completion of procedure in a timely manner, assisting with retraction, instrumentation, and closure)     ANESTHESIA: Spinal  ESTIMATED BLOOD LOSS: 250cc    COMPLICATIONS:  None.     COMPONENTS:   Stryker Trident 2 52mm acetabular shell, 6.5 hex screws x 2, neutral X3 polyethylene insert alpha code D, Accolade 2 size 5 stem with 127 degree neck angle, 36+0 mm ceramic head  Implant Name Type Inv. Item Serial No. Manufacturer Lot No. LRB No. Used Action  SHELL CLUSTERHOLE ACETABULAR 5 - [id number] Shell SHELL CLUSTERHOLE ACETABULAR 5  [employer] [id number] Right 1 Implanted  INSERT TRIDENT POLY 36 0DEG - [id number] Insert INSERT TRIDENT POLY 36 0DEG  [employer] [id number] Right 1 Implanted  SCREW HEX LP 6.5X20 - [id number] Screw SCREW HEX LP 6.5X20  [employer] MBAH Right 1 Implanted  SCREW HEX LP 6.5X25 - [id number] Screw SCREW HEX LP 6.5X25  [employer] MCA  Right 1 Implanted  STEM HIP 127 DEG - [id number] Stem STEM HIP 127 DEG  [employer] [id number] A Right 1 Implanted  HEAD CERAMIC FEMORAL - [id number] Head HEAD CERAMIC FEMORAL  [employer] [id number] Right 1 Implanted     PROCEDURE IN DETAIL:   The patient was met in the holding area and  identified.  The appropriate hip was identified and marked at the operative site.  The patient was then transported to the OR  and  placed under anesthesia.  At that point, the patient was  placed in the lateral decubitus position with the operative side up and  secured to the operating room table  and all bony prominences padded. A subaxillary role was also placed.    The operative lower extremity was prepped from the iliac crest to the distal leg.  Sterile draping was performed.  Preoperative antibiotics, 2 gm of ancef ,1 gm of Tranexamic Acid , and 8 mg of Decadron  administered. Time out was performed prior to incision.      A routine posterolateral approach was utilized via sharp dissection  carried down to the subcutaneous tissue.  Gross bleeders were Bovie coagulated.  The iliotibial band was identified and incised along the length of the skin incision through the glute max fascia.  Charnley retractor was placed with care to protect the sciatic nerve posteriorly.  With the hip internally rotated, the piriformis tendon was identified and released from the femoral insertion and tagged with a #5 Ethibond.  A capsulotomy was then performed off the femoral insertion and also tagged with a #5 Ethibond.    The femoral neck was exposed, and  I resected the femoral neck based on preoperative templating relative to the lesser trochanter.    I then exposed the deep acetabulum, cleared out any tissue including the ligamentum teres.  After adequate visualization, I excised the labrum.  I then started reaming with a 46 mm reamer, first medializing to the floor of the cotyloid fossa, and then in the position  of the cup aiming towards the greater sciatic notch, matching the version of the transverse acetabular ligament and tucked under the anterior wall. I reamed up to 52 mm reamer with good bony bed preparation and a 52 mm cup was chosen.  The real cup was then impacted into place.  Appropriate version and inclination was confirmed clinically matching their bony anatomy, and also with the use of the jig.  I placed 2 screws in the posterior superior quadrant to augment fixation.  A neutral liner was placed and impacted. It was confirmed to be appropriately seated and the acetabular retractors were removed.    I then prepared the proximal femur using the box cutter, Charnley awl, and then sequentially broached starting with 0 up to a size 4.  A trial broach, neck, and head was utilized, and I reduced the hip and it was found to have excellent stability.  There was no impingement with full extension and 90 degrees external rotation.  The hip was stable at the position of sleep and with 90 degrees flexion and 90 degrees of internal rotation.  Leg lengths were also clinically assessed in the lateral position and felt to be equal. Intra-Op flatplate was obtained and confirmed appropriate component positions.  4 was in good alignment but felt there was room to likely go up to the next size.  There was good restoration of leg length and offset. No evidence or concern for fracture. We worked up to the size 5 broach and were able to seated to the same depth as a size 4 broach.  Felt this had good axial rotational stability. A final femoral prosthesis size 5 was selected. I then impacted the real femoral prosthesis into place.I again trialed and selected a 36+ 0mm ball. The hip was then reduced and taken through a range of motion. There was no impingement with full extension and 90 degrees external rotation.  The hip was stable at the position of sleep and with 90 degrees flexion and 90degrees of internal rotation. Leg  lengths were  again assessed and felt to be restored.  We then opened, and I impacted the real head ball into place.  The posterior capsule was then closed with #5 Ethibond.  The piriformis was repaired through the base of the abductor tendon using a [place] suture passer.  I then irrigated the hip copiously with dilute Betadine  and with normal saline pulse lavage. Periarticular injection was then performed with Exparel .   We repaired the fascia #1 barbed suture, followed by 0 barbed suture for the subcutaneous fat.  Skin was closed with 2-0 Vicryl and 3-0 Monocryl.  Dermabond and Aquacel dressing were applied. The patient was then awakened and returned to PACU in stable and satisfactory condition.  Leg lengths in the supine position were assessed and felt to be clinically equal. There were no complications.  Post op recs: WB: WBAT RLE, No formal hip precautions Abx: ancef  Imaging: PACU pelvis Xray Dressing: Aquacell, keep intact until follow up DVT prophylaxis: Aspirin  81BID starting POD1 Follow up: 2 weeks after surgery for a wound check with Dr. Edna at [facility].  Address: [address], [place], [place] [address]  Office Phone: [phone]   Toribio Higashi, MD Orthopedic Surgeon

## 2023-10-27 NOTE — Evaluation (Signed)
 Physical Therapy Evaluation Patient Details Name: Douglas Turner MRN: [id number] DOB: September 27, 1990 Today's Date: 10/27/2023  History of Present Illness  33 yo male presents to therapy s/p R THA on 09/27/2023 due to avascular necrosis and failure of conservative measures. Pt PMH includes but is not limited to: tobacco abuse and L THA on 09/15/2023 .  Clinical Impression     GIL INGWERSEN is a 33 y.o. male POD 0 s/p R THA. Patient reports IND with mobility at baseline. Patient is now limited by functional impairments (see PT problem list below) and requires CGA and cues for transfers and gait with RW. Patient was able to ambulate 50 feet with RW and CGA and cues for safe walker management. Patient educated on safe sequencing for stair mobility with use of B UE support on L handrail, pain management, fall risk prevention, use of CP/ice, and car transfers pt and family verbalized understanding of safe guarding position for people assisting with mobility. Patient instructed in exercises to facilitate ROM and circulation reviewed and HO provided. Patient will benefit from continued skilled PT interventions to address impairments and progress towards PLOF. Patient has met mobility goals at adequate level for discharge home with family support and OPPT services; will continue to follow if pt continues acute stay to progress towards Mod I goals.       If plan is discharge home, recommend the following: A little help with walking and/or transfers;A little help with bathing/dressing/bathroom;Assistance with cooking/housework;Assist for transportation;Help with stairs or ramp for entrance   Can travel by private vehicle        Equipment Recommendations None recommended by PT  Recommendations for Other Services       Functional Status Assessment Patient has had a recent decline in their functional status and demonstrates the ability to make significant improvements in function in a reasonable and  predictable amount of time.     Precautions / Restrictions Precautions Precautions: Fall Restrictions Weight Bearing Restrictions Per Provider Order: No      Mobility  Bed Mobility Overal bed mobility: Needs Assistance Bed Mobility: Supine to Sit     Supine to sit: Supervision     General bed mobility comments: min cues    Transfers Overall transfer level: Needs assistance Equipment used: Rolling walker (2 wheels) Transfers: Sit to/from Stand Sit to Stand: Contact guard assist           General transfer comment: min cues for RW management    Ambulation/Gait Ambulation/Gait assistance: Contact guard assist Gait Distance (Feet): 50 Feet Assistive device: Rolling walker (2 wheels) Gait Pattern/deviations: Step-to pattern, Decreased stance time - right, Antalgic, Trunk flexed Gait velocity: decreased     General Gait Details: B UE support at RW to offload R LE in stance phase with slight trunk flexion, min cues, L toe in and mild instability reported in hips, one LOB with retrograde stepping requiring PT to provide mod/max A for fall prevention no further overt LOB demonstrated  Stairs Stairs: Yes Stairs assistance: Contact guard assist Stair Management: Two rails, One rail Left Number of Stairs: 2 General stair comments: min cues for safety, sequecing and step to pattern with pt now ascending with L LE vs R PLOF, step navigation initiated with B handrail and progressed to L only emulating home setting with support on L side  Wheelchair Mobility     Tilt Bed    Modified Rankin (Stroke Patients Only)       Balance Overall balance assessment: Needs  assistance Sitting-balance support: Feet supported Sitting balance-Leahy Scale: Good     Standing balance support: Bilateral upper extremity supported, During functional activity, Reliant on assistive device for balance Standing balance-Leahy Scale: Fair Standing balance comment: pt able to maintain static  standing no UE support, one episode of LOB                             Pertinent Vitals/Pain Pain Assessment Pain Assessment: 0-10 Pain Score: 7  Pain Location: R hip and leg Pain Descriptors / Indicators: Aching, Burning, Discomfort, Dull, Operative site guarding, Tightness Pain Intervention(s): Limited activity within patient's tolerance, Monitored during session, Premedicated before session, Repositioned, Ice applied    Home Living Family/patient expects to be discharged to:: Private residence Living Arrangements: Spouse/significant other Available Help at Discharge: Family Type of Home: House Home Access: Stairs to enter Entrance Stairs-Rails: None (can grab a support on the L side) Entrance Stairs-Number of Steps: 2   Home Layout: One level Home Equipment: Cane - single point;Rolling Walker (2 wheels)      Prior Function Prior Level of Function : Independent/Modified Independent;Working/employed;Driving             Mobility Comments: IND no AD for all ADls, self care tasks and IADLs       Extremity/Trunk Assessment        Lower Extremity Assessment Lower Extremity Assessment: RLE deficits/detail RLE Deficits / Details: ankle DF/PF 5/5 RLE Sensation: decreased proprioception (proximal B LE)    Cervical / Trunk Assessment Cervical / Trunk Assessment: Normal  Communication   Communication Communication: No apparent difficulties    Cognition Arousal: Alert Behavior During Therapy: WFL for tasks assessed/performed   PT - Cognitive impairments: No apparent impairments                         Following commands: Intact       Cueing       General Comments      Exercises Total Joint Exercises Ankle Circles/Pumps: AROM, Both, 10 reps Quad Sets: AROM, 5 reps, Right Heel Slides: AROM, 5 reps, Right Hip ABduction/ADduction: AROM, 5 reps, Standing, Right Knee Flexion: Right, AROM, 5 reps, Standing Standing Hip Extension: AROM,  Right, 5 reps, Standing   Assessment/Plan    PT Assessment Patient needs continued PT services  PT Problem List Decreased strength;Decreased range of motion;Decreased activity tolerance;Decreased balance;Decreased mobility;Pain       PT Treatment Interventions DME instruction;Gait training;Stair training;Functional mobility training;Therapeutic activities;Therapeutic exercise;Balance training;Neuromuscular re-education;Patient/family education;Modalities    PT Goals (Current goals can be found in the Care Plan section)  Acute Rehab PT Goals Patient Stated Goal: to get back to work and doing everything no pain or AD PT Goal Formulation: With patient Time For Goal Achievement: 11/10/23 Potential to Achieve Goals: Good    Frequency 7X/week     Co-evaluation               AM-PAC PT 6 Clicks Mobility  Outcome Measure Help needed turning from your back to your side while in a flat bed without using bedrails?: None Help needed moving from lying on your back to sitting on the side of a flat bed without using bedrails?: A Little Help needed moving to and from a bed to a chair (including a wheelchair)?: A Little Help needed standing up from a chair using your arms (e.g., wheelchair or bedside chair)?: A Little Help needed to  walk in hospital room?: A Little Help needed climbing 3-5 steps with a railing? : A Little 6 Click Score: 19    End of Session Equipment Utilized During Treatment: Gait belt Activity Tolerance: Patient tolerated treatment well;No increased pain Patient left: in chair;with call bell/phone within reach;with family/visitor present Nurse Communication: Mobility status;Other (comment) (pt readiness for d/c from PT standpoint) PT Visit Diagnosis: Unsteadiness on feet (R26.81);Other abnormalities of gait and mobility (R26.89);Muscle weakness (generalized) (M62.81);Difficulty in walking, not elsewhere classified (R26.2);Pain Pain - Right/Left: Right Pain - part of  body: Hip;Leg    Time: 8584-8541 PT Time Calculation (min) (ACUTE ONLY): 43 min   Charges:   PT Evaluation $PT Eval Low Complexity: 1 Low PT Treatments $Gait Training: 8-22 mins $Therapeutic Exercise: 8-22 mins PT General Charges $$ ACUTE PT VISIT: 1 Visit         [place], PT Acute Rehab   [place] VEAR Drone 10/27/2023, 3:31 PM

## 2023-10-28 ENCOUNTER — Encounter (HOSPITAL_COMMUNITY): Payer: Self-pay | Admitting: Orthopedic Surgery
